# Patient Record
Sex: Male | Born: 1985 | Race: White | Hispanic: No | Marital: Married | Smoking: Current every day smoker
Health system: Southern US, Community
[De-identification: ages and names within clinical notes are randomized; demographics above are authoritative.]

## PROBLEM LIST (undated history)

## (undated) DIAGNOSIS — J302 Other seasonal allergic rhinitis: Secondary | ICD-10-CM

## (undated) DIAGNOSIS — F419 Anxiety disorder, unspecified: Secondary | ICD-10-CM

## (undated) DIAGNOSIS — J45909 Unspecified asthma, uncomplicated: Secondary | ICD-10-CM

## (undated) DIAGNOSIS — T7840XA Allergy, unspecified, initial encounter: Secondary | ICD-10-CM

## (undated) HISTORY — DX: Anxiety disorder, unspecified: F41.9

## (undated) HISTORY — PX: TYMPANOSTOMY TUBE PLACEMENT: SHX32

## (undated) HISTORY — DX: Allergy, unspecified, initial encounter: T78.40XA

## (undated) HISTORY — DX: Unspecified asthma, uncomplicated: J45.909

## (undated) HISTORY — PX: DENTAL SURGERY: SHX609

---
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
## 2010-10-31 ENCOUNTER — Emergency Department (HOSPITAL_COMMUNITY): Admission: EM | Admit: 2010-10-31 | Payer: Self-pay | Source: Home / Self Care

## 2011-04-29 ENCOUNTER — Encounter (HOSPITAL_COMMUNITY): Payer: Self-pay | Admitting: *Deleted

## 2011-04-29 ENCOUNTER — Emergency Department (HOSPITAL_COMMUNITY)
Admission: EM | Admit: 2011-04-29 | Discharge: 2011-04-29 | Disposition: A | Discharge status: Home / Self Care | Payer: 59 | Attending: Emergency Medicine | Admitting: Emergency Medicine

## 2011-04-29 DIAGNOSIS — S61409A Unspecified open wound of unspecified hand, initial encounter: Secondary | ICD-10-CM | POA: Insufficient documentation

## 2011-04-29 DIAGNOSIS — W260XXA Contact with knife, initial encounter: Secondary | ICD-10-CM | POA: Insufficient documentation

## 2011-04-29 DIAGNOSIS — Z23 Encounter for immunization: Secondary | ICD-10-CM | POA: Insufficient documentation

## 2011-04-29 DIAGNOSIS — S61419A Laceration without foreign body of unspecified hand, initial encounter: Secondary | ICD-10-CM

## 2011-04-29 DIAGNOSIS — W261XXA Contact with sword or dagger, initial encounter: Secondary | ICD-10-CM | POA: Insufficient documentation

## 2011-04-29 MED ORDER — LIDOCAINE HCL (PF) 1 % IJ SOLN
INTRAMUSCULAR | Status: AC
  Filled 2011-04-29: qty 5

## 2011-04-29 MED ORDER — DOUBLE ANTIBIOTIC 500-10000 UNIT/GM EX OINT
TOPICAL_OINTMENT | Freq: Once | CUTANEOUS | Status: AC
  Administered 2011-04-29: 13:00:00 via TOPICAL
  Filled 2011-04-29: qty 1

## 2011-04-29 MED ORDER — TETANUS-DIPHTHERIA TOXOIDS TD 5-2 LFU IM INJ
0.5000 mL | INJECTION | Freq: Once | INTRAMUSCULAR | Status: AC
  Administered 2011-04-29: 0.5 mL via INTRAMUSCULAR
  Filled 2011-04-29: qty 0.5

## 2011-04-29 MED ORDER — TETANUS-DIPHTH-ACELL PERTUSSIS 5-2.5-18.5 LF-MCG/0.5 IM SUSP: 0.5000 mL | Freq: Once | INTRAMUSCULAR | Status: DC

## 2011-04-29 MED ORDER — LIDOCAINE HCL (PF) 1 % IJ SOLN
5.0000 mL | Freq: Once | INTRAMUSCULAR | Status: AC
  Administered 2011-04-29: 5 mL via INTRADERMAL

## 2011-04-29 NOTE — ED Notes (Signed)
Laceration to base of right 4th finger. Bleeding controlled. 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 Unsure of last tetanus.

## 2011-04-29 NOTE — ED Provider Notes (Signed)
History     CSN: [id number]  Arrival date & time 04/29/11  1013   First MD Initiated Contact with Patient 04/29/11 1129      Chief Complaint  Patient presents with  . Extremity Laceration    (Consider location/radiation/quality/duration/timing/severity/associated sxs/prior treatment) Patient is a 26 y.o. male presenting with skin laceration. The history is provided by the patient. No language interpreter was used.  Laceration  The incident occurred 3 to 5 hours ago. The laceration is located on the right hand. The laceration is 4 cm in size. The laceration mechanism was a a clean knife. The pain is mild. The pain has been constant since onset. He reports no foreign bodies present. His tetanus status is unknown.    History reviewed. No pertinent past medical history.  Past Surgical History  Procedure Date  . Dental surgery     No family history on file.  History  Substance Use Topics  . Smoking status: Never Smoker   . Smokeless tobacco: Current User    Types: Snuff  . Alcohol Use: Yes     Occ      Review of Systems  Constitutional: Negative for fever and chills.  Gastrointestinal: Negative for vomiting.  Musculoskeletal: Negative for myalgias and arthralgias.  Skin: Positive for wound. Negative for rash.  Neurological: Negative for weakness and numbness.  Hematological: Does not bruise/bleed easily.  All other systems reviewed and are negative.    Allergies  Erythromycin and Penicillins  Home Medications  No current outpatient prescriptions on file.  BP 136/84  Pulse 91  Temp(Src) 98.8 F (37.1 C) (Oral)  Resp 16  Ht 5\' 11"  (1.803 m)  Wt 165 lb (74.844 kg)  BMI 23.01 kg/m2  SpO2 100%  Physical Exam  Nursing note and vitals reviewed. Constitutional: He is oriented to person, place, and time. He appears well-developed and well-nourished. No distress.  HENT:  Head: Normocephalic and atraumatic.  Mouth/Throat: Oropharynx is clear and moist.    Cardiovascular: Normal rate, regular rhythm and normal heart sounds.   Pulmonary/Chest: Effort normal and breath sounds normal. No respiratory distress.  Musculoskeletal: Normal range of motion. He exhibits tenderness. He exhibits no edema.       Right hand: He exhibits laceration. He exhibits normal range of motion, no tenderness, no bony tenderness, normal two-point discrimination, normal capillary refill, no deformity and no swelling. normal sensation noted. Normal strength noted.       Hands: Neurological: He is alert and oriented to person, place, and time. He exhibits normal muscle tone. Coordination normal.  Skin: Skin is warm and dry.       See ms exam    ED Course  Procedures (including critical care time)    LACERATION REPAIR Performed by: Shauna Bodkins L. Authorized by: Maxwell Caul Consent: Verbal consent obtained. Risks and benefits: risks, benefits and alternatives were discussed Consent given by: patient Patient identity confirmed: provided demographic data Prepped and Draped in normal sterile fashion Wound explored  Laceration Location: dorsal right hand Laceration Length:  4cm  No Foreign Bodies seen or palpated  Anesthesia: local infiltration  Local anesthetic: lidocaine1% w/o epinephrine  Anesthetic total: 3 ml  Irrigation method: syringe Amount of cleaning: standard  Skin closure: 4-0 prolene Number of sutures: 5 Technique: simple interrupted  Patient tolerance: Patient tolerated the procedure well with no immediate complications.    MDM   Wound(s) explored with adequate hemostasis through ROM, no apparent gross foreign body retained, no significant involvement of deep structures such  as bone / joint / tendon / or neurovascular involvement noted.  Baseline Strength and Sensation to affected extremity(ies) with normal light touch for Pt, distal NVI with CR< 2 secs and pulse(s) intact to affected extremity(ies).      Patient / Family /  Caregiver understand and agree with initial ED impression and plan with expectations set for ED visit. Pt stable in ED with no significant deterioration in condition.   Mujahid Jalomo L. [employer], [place] 05/01/11 1952

## 2011-04-29 NOTE — ED Notes (Signed)
Pt has laceration to the base of his right 4th finger. No bleeding at present. Pt able to move his finger with no difficulty. Pt's hand pink and warm to touch. Brisk capillary refill to nailbeds.

## 2011-05-02 NOTE — ED Provider Notes (Signed)
Pt has a linear laceration near the MCP of his RRF from a knife, placed horizontally. The wound appears clean.   Medical screening examination/treatment/procedure(s) were conducted as a shared visit with non-physician practitioner(s) and myself.  I personally evaluated the patient during the encounter Devoria Albe, MD, Franz Dell, MD 05/02/11 [phone]

## 2012-03-05 ENCOUNTER — Ambulatory Visit (INDEPENDENT_AMBULATORY_CARE_PROVIDER_SITE_OTHER): Payer: 59 | Admitting: Family Medicine

## 2012-03-05 VITALS — BP 149/73 | HR 73 | Temp 98.1°F | Resp 18 | Ht 72.0 in | Wt 160.0 lb

## 2012-03-05 DIAGNOSIS — J40 Bronchitis, not specified as acute or chronic: Secondary | ICD-10-CM

## 2012-03-05 MED ORDER — AZITHROMYCIN 250 MG PO TABS: ORAL_TABLET | ORAL | Status: DC

## 2012-03-05 MED ORDER — HYDROCODONE-HOMATROPINE 5-1.5 MG/5ML PO SYRP: 5.0000 mL | ORAL_SOLUTION | Freq: Three times a day (TID) | ORAL | Status: DC | PRN

## 2012-03-05 NOTE — Progress Notes (Signed)
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 He's taken some Allegra for this but the symptoms have just fluctuated. In the last week he's developed more cough without fever. There is no significant production of phlegm. He's had no sore throat, chills, shortness of breath or chest pain  Objective: HEENT is unremarkable No acute distress Chest: Few rhonchi Heart: Regular Skin: Unremarkable  Assessment: Bronchitis, worsening  Plan: Z-Pak and Hydromet

## 2014-11-16 ENCOUNTER — Encounter (HOSPITAL_COMMUNITY): Payer: Self-pay | Admitting: *Deleted

## 2014-11-16 ENCOUNTER — Emergency Department (HOSPITAL_COMMUNITY)
Admission: EM | Admit: 2014-11-16 | Discharge: 2014-11-16 | Disposition: A | Discharge status: Home / Self Care | Payer: Commercial Managed Care - PPO | Attending: Emergency Medicine | Admitting: Emergency Medicine

## 2014-11-16 ENCOUNTER — Emergency Department (HOSPITAL_COMMUNITY): Payer: Commercial Managed Care - PPO

## 2014-11-16 DIAGNOSIS — Z792 Long term (current) use of antibiotics: Secondary | ICD-10-CM | POA: Diagnosis not present

## 2014-11-16 DIAGNOSIS — M5442 Lumbago with sciatica, left side: Secondary | ICD-10-CM | POA: Diagnosis not present

## 2014-11-16 DIAGNOSIS — Z79899 Other long term (current) drug therapy: Secondary | ICD-10-CM | POA: Insufficient documentation

## 2014-11-16 DIAGNOSIS — M545 Low back pain: Secondary | ICD-10-CM | POA: Diagnosis present

## 2014-11-16 DIAGNOSIS — Z880 Allergy status to penicillin: Secondary | ICD-10-CM | POA: Insufficient documentation

## 2014-11-16 DIAGNOSIS — J45909 Unspecified asthma, uncomplicated: Secondary | ICD-10-CM | POA: Insufficient documentation

## 2014-11-16 LAB — RAPID URINE DRUG SCREEN, HOSP PERFORMED
AMPHETAMINES: NOT DETECTED
BARBITURATES: NOT DETECTED
Benzodiazepines: NOT DETECTED
Cocaine: NOT DETECTED
OPIATES: NOT DETECTED
TETRAHYDROCANNABINOL: NOT DETECTED

## 2014-11-16 LAB — URINALYSIS, ROUTINE W REFLEX MICROSCOPIC
BILIRUBIN URINE: NEGATIVE
Glucose, UA: NEGATIVE mg/dL
HGB URINE DIPSTICK: NEGATIVE
KETONES UR: 15 mg/dL — AB
Leukocytes, UA: NEGATIVE
NITRITE: NEGATIVE
Protein, ur: NEGATIVE mg/dL
SPECIFIC GRAVITY, URINE: 1.015 (ref 1.005–1.030)
UROBILINOGEN UA: 0.2 mg/dL (ref 0.0–1.0)
pH: 7 (ref 5.0–8.0)

## 2014-11-16 LAB — CBC WITH DIFFERENTIAL/PLATELET
BASOS ABS: 0 10*3/uL (ref 0.0–0.1)
BASOS PCT: 1 % (ref 0–1)
EOS PCT: 0 % (ref 0–5)
Eosinophils Absolute: 0 10*3/uL (ref 0.0–0.7)
HCT: 45.1 % (ref 39.0–52.0)
Hemoglobin: 16.1 g/dL (ref 13.0–17.0)
LYMPHS PCT: 24 % (ref 12–46)
Lymphs Abs: 1.9 10*3/uL (ref 0.7–4.0)
MCH: 31.5 pg (ref 26.0–34.0)
MCHC: 35.7 g/dL (ref 30.0–36.0)
MCV: 88.3 fL (ref 78.0–100.0)
MONO ABS: 0.5 10*3/uL (ref 0.1–1.0)
Monocytes Relative: 6 % (ref 3–12)
Neutro Abs: 5.3 10*3/uL (ref 1.7–7.7)
Neutrophils Relative %: 69 % (ref 43–77)
PLATELETS: 290 10*3/uL (ref 150–400)
RBC: 5.11 MIL/uL (ref 4.22–5.81)
RDW: 11.9 % (ref 11.5–15.5)
WBC: 7.8 10*3/uL (ref 4.0–10.5)

## 2014-11-16 LAB — COMPREHENSIVE METABOLIC PANEL
ALBUMIN: 4.7 g/dL (ref 3.5–5.0)
ALT: 29 U/L (ref 17–63)
AST: 41 U/L (ref 15–41)
Alkaline Phosphatase: 65 U/L (ref 38–126)
Anion gap: 16 — ABNORMAL HIGH (ref 5–15)
CHLORIDE: 102 mmol/L (ref 101–111)
CO2: 21 mmol/L — AB (ref 22–32)
CREATININE: 1.01 mg/dL (ref 0.61–1.24)
Calcium: 9.9 mg/dL (ref 8.9–10.3)
GFR calc Af Amer: >60 mL/min (ref >60)
GFR calc non Af Amer: >60 mL/min (ref >60)
GLUCOSE: 119 mg/dL — AB (ref 65–99)
POTASSIUM: 3.5 mmol/L (ref 3.5–5.1)
SODIUM: 139 mmol/L (ref 135–145)
Total Bilirubin: 1.8 mg/dL — ABNORMAL HIGH (ref 0.3–1.2)
Total Protein: 8 g/dL (ref 6.5–8.1)

## 2014-11-16 LAB — C-REACTIVE PROTEIN

## 2014-11-16 MED ORDER — HYDROMORPHONE HCL 1 MG/ML IJ SOLN: 1.0000 mg | Freq: Once | INTRAMUSCULAR | Status: DC

## 2014-11-16 MED ORDER — IBUPROFEN 800 MG PO TABS: 800.0000 mg | ORAL_TABLET | Freq: Three times a day (TID) | ORAL | Status: DC

## 2014-11-16 MED ORDER — LORAZEPAM 1 MG PO TABS
1.0000 mg | ORAL_TABLET | Freq: Once | ORAL | Status: AC
Start: 2014-11-16 — End: 2014-11-16
  Administered 2014-11-16: 1 mg via ORAL
  Filled 2014-11-16: qty 2

## 2014-11-16 MED ORDER — METHOCARBAMOL 500 MG PO TABS: 500.0000 mg | ORAL_TABLET | Freq: Two times a day (BID) | ORAL | Status: DC

## 2014-11-16 MED ORDER — DEXAMETHASONE SODIUM PHOSPHATE 10 MG/ML IJ SOLN
10.0000 mg | Freq: Once | INTRAMUSCULAR | Status: AC
  Administered 2014-11-16: 10 mg via INTRAMUSCULAR
  Filled 2014-11-16: qty 1

## 2014-11-16 NOTE — ED Notes (Signed)
Pt reports bilateral hand tingling. MAE well.

## 2014-11-16 NOTE — ED Provider Notes (Signed)
CSN: [id number]     Arrival date & time 11/16/14  1443 History   First MD Initiated Contact with Patient 11/16/14 1942     Chief Complaint  Patient presents with  . Back Pain    HPI   Aaron Levine is a 29 y.o. male with no significant PMH who presents to the ED with back pain. He reports he was sitting down watching TV last night when his pain started. He reports his pain is located in his lower back and that he has tingling down his left leg. He reports his symptoms are constant. He states initially, sitting exacerbated his pain. He reports movement and changing position worsen his symptoms. He has tried ice for symptom relief, which was only minimally effective. He reports he works at [employer] job, but was doing labor around the house this weekend. He denies recent trauma.  Patient denies history of cancer, IVDU, anticoagulant use, bowel or bladder incontinence, saddle anesthesia, weakness. Reports he is able to ambulate. Denies fever, chills, chest pain, shortness of breath, abdominal pain, nausea, vomiting, diarrhea, constipation, dysuria, urgency, frequency, penile discharge, syncope, lightheadedness, dizziness, headache.    Past Medical History  Diagnosis Date  . Allergy   . Asthma    Past Surgical History  Procedure Laterality Date  . Dental surgery     No family history on file. Social History  Substance Use Topics  . Smoking status: Never Smoker   . Smokeless tobacco: Current User    Types: Snuff  . Alcohol Use: 1.0 oz/week    2 drink(s) per week     Comment: Occ    Review of Systems  Constitutional: Negative for fever, chills, diaphoresis, appetite change and fatigue.  Eyes: Negative for visual disturbance.  Respiratory: Negative for shortness of breath.   Cardiovascular: Negative for chest pain, palpitations and leg swelling.  Gastrointestinal: Negative for nausea, vomiting, abdominal pain, diarrhea and constipation.  Genitourinary: Negative for dysuria, urgency,  frequency, flank pain and discharge.  Musculoskeletal: Positive for back pain and arthralgias. Negative for myalgias, gait problem, neck pain and neck stiffness.  Skin: Negative for color change, pallor, rash and wound.  Neurological: Positive for numbness. Negative for dizziness, syncope, weakness, light-headedness and headaches.       Reports numbness and paresthesia to left lower extremity.  All other systems reviewed and are negative.    Allergies  Erythromycin and Penicillins  Home Medications   Prior to Admission medications   Medication Sig Start Date End Date Taking? Authorizing Provider  azithromycin (ZITHROMAX Z-PAK) 250 MG tablet Take as directed on pack 03/05/12   Elvina Sidle, MD  HYDROcodone-homatropine [facility]) 5-1.5 MG/5ML syrup Take 5 mLs by mouth every 8 (eight) hours as needed for cough. 03/05/12   Elvina Sidle, MD  ibuprofen (ADVIL,MOTRIN) 800 MG tablet Take 1 tablet (800 mg total) by mouth 3 (three) times daily. 11/16/14   Mady Gemma, PA-C  methocarbamol (ROBAXIN) 500 MG tablet Take 1 tablet (500 mg total) by mouth 2 (two) times daily. 11/16/14   Mady Gemma, PA-C  Pseudoephedrine-APAP-DM (DAYQUIL MULTI-SYMPTOM COLD/FLU PO) Take by mouth.    Historical Provider, MD    BP 138/94 mmHg  Pulse 85  Temp(Src) 99.3 F (37.4 C) (Oral)  Resp 15  Ht 5\' 10"  (1.778 m)  Wt 165 lb (74.844 kg)  BMI 23.68 kg/m2  SpO2 97% Physical Exam  Constitutional: He is oriented to person, place, and time. He appears well-developed and well-nourished. No distress.  HENT:  Head: Normocephalic and atraumatic.  Right Ear: External ear normal.  Left Ear: External ear normal.  Nose: Nose normal.  Mouth/Throat: Oropharynx is clear and moist. No oropharyngeal exudate.  Eyes: Conjunctivae and EOM are normal. Pupils are equal, round, and reactive to light. Right eye exhibits no discharge. Left eye exhibits no discharge. No scleral icterus.  Neck: Normal range of  motion. Neck supple. No spinous process tenderness and no muscular tenderness present.  Cardiovascular: Normal rate, regular rhythm, normal heart sounds and intact distal pulses.   Pulmonary/Chest: Effort normal and breath sounds normal. No respiratory distress. He has no wheezes. He has no rales. He exhibits no tenderness.  Abdominal: Soft. Normal appearance and bowel sounds are normal. He exhibits no distension and no mass. There is no tenderness. There is no rebound, no guarding and no CVA tenderness.  Musculoskeletal: Normal range of motion. He exhibits tenderness. He exhibits no edema.       Lumbar back: He exhibits tenderness and bony tenderness. He exhibits no deformity.  Lumbar spine tender to palpation at midline. No step-off or deformity. Tenderness to palpation of left lumbar paraspinal muscles. No pain with straight leg raise bilaterally.   Neurological: He is alert and oriented to person, place, and time. He has normal strength. No sensory deficit.  Skin: Skin is warm, dry and intact. No rash noted. He is not diaphoretic. No erythema. No pallor.  Psychiatric: He has a normal mood and affect. His behavior is normal. Judgment and thought content normal.  Nursing note and vitals reviewed.   ED Course  Procedures (including critical care time)  Labs Review Labs Reviewed  COMPREHENSIVE METABOLIC PANEL - Abnormal; Notable for the following:    CO2 21 (*)    Glucose, Bld 119 (*)    BUN <5 (*)    Total Bilirubin 1.8 (*)    Anion gap 16 (*)    All other components within normal limits  URINALYSIS, ROUTINE W REFLEX MICROSCOPIC (NOT AT [facility]) - Abnormal; Notable for the following:    Ketones, ur 15 (*)    All other components within normal limits  CBC WITH DIFFERENTIAL/PLATELET  URINE RAPID DRUG SCREEN, HOSP PERFORMED  C-REACTIVE PROTEIN    Imaging Review Dg Lumbar Spine Complete  11/16/2014   CLINICAL DATA:  Low back pain  EXAM: LUMBAR SPINE - COMPLETE 4+ VIEW  COMPARISON:  None   FINDINGS: There is no evidence of lumbar spine fracture. Alignment is normal. Intervertebral disc spaces are maintained.  IMPRESSION: Negative.   Electronically Signed   By: Signa Kell M.D.   On: 11/16/2014 20:51     I have personally reviewed and evaluated these images and lab results as part of my medical decision-making.   EKG Interpretation None      MDM   Final diagnoses:  Low back pain with left-sided sciatica, unspecified back pain laterality   29 year old male presents with low back pain, which began yesterday. Patient denies history of cancer, IVDU, anticoagulant use, bowel or bladder incontinence, saddle anesthesia, weakness. Tenderness to palpation over midline lumbar spine and over left lumbar paraspinal muscles on exam. No step off or deformity. He is able to ambulate. No neurological deficits and normal neuro exam. No concern for cauda equina.  Given midline tenderness, x-ray of lumbar spine obtained. Imaging negative for fracture.  Symptoms most likely consistent with lumbar strain, given recent history of house work over the weekend. Given decadron injection in the ED. Patient reports pain  is controlled at this time. Patient is afebrile and vital signs are stable. Will discharge with ibuprofen and robaxin. Return precautions discussed. Patient to follow up with PCP this week.   BP 138/94 mmHg  Pulse 85  Temp(Src) 99.3 F (37.4 C) (Oral)  Resp 15  Ht 5\' 10"  (1.778 m)  Wt 165 lb (74.844 kg)  BMI 23.68 kg/m2  SpO2 97%      Mady Gemma, PA-C 11/17/14 0114  Eber Hong, MD 11/18/14 [phone]

## 2014-11-16 NOTE — ED Notes (Signed)
Pt reports back pain today while at work. Pt reports using ice and with some relief. Pt reports tingling in legs. PMS intact. Pain is not worsened with movement but with sitting. Pt appears anxious in triage.

## 2014-11-16 NOTE — Discharge Instructions (Signed)
1. Medications: ibuprofen, robaxin, usual home medications 2. Treatment: rest, drink plenty of fluids, ice, back exercises 3. Follow Up: please followup with your primary doctor this week for discussion of your diagnoses and further evaluation after today's visit; if you do not have a primary care doctor use the resource guide provided to find one; please return to the ER for fever chills, bowel or bladder incontinence, numbness in saddle region, weakness, new or worsening symptoms   Back Exercises Back exercises help treat and prevent back injuries. The goal of back exercises is to increase the strength of your abdominal and back muscles and the flexibility of your back. These exercises should be started when you no longer have back pain. Back exercises include:  Pelvic Tilt. Lie on your back with your knees bent. Tilt your pelvis until the lower part of your back is against the floor. Hold this position 5 to 10 sec and repeat 5 to 10 times.  Knee to Chest. Pull first 1 knee up against your chest and hold for 20 to 30 seconds, repeat this with the other knee, and then both knees. This may be done with the other leg straight or bent, whichever feels better.  Sit-Ups or Curl-Ups. Bend your knees 90 degrees. Start with tilting your pelvis, and do a partial, slow sit-up, lifting your trunk only 30 to 45 degrees off the floor. Take at least 2 to 3 seconds for each sit-up. Do not do sit-ups with your knees out straight. If partial sit-ups are difficult, simply do the above but with only tightening your abdominal muscles and holding it as directed.  Hip-Lift. Lie on your back with your knees flexed 90 degrees. Push down with your feet and shoulders as you raise your hips a couple inches off the floor; hold for 10 seconds, repeat 5 to 10 times.  Back arches. Lie on your stomach, propping yourself up on bent elbows. Slowly press on your hands, causing an arch in your low back. Repeat 3 to 5 times. Any  initial stiffness and discomfort should lessen with repetition over time.  Shoulder-Lifts. Lie face down with arms beside your body. Keep hips and torso pressed to floor as you slowly lift your head and shoulders off the floor. Do not overdo your exercises, especially in the beginning. Exercises may cause you some mild back discomfort which lasts for a few minutes; however, if the pain is more severe, or lasts for more than 15 minutes, do not continue exercises until you see your caregiver. Improvement with exercise therapy for back problems is slow.  See your caregivers for assistance with developing a proper back exercise program. Document Released: 04/20/2004 Document Revised: 06/05/2011 Document Reviewed: 01/12/2011 [facility] Patient Information 2015 [place], [place]. This information is not intended to replace advice given to you by your health care provider. Make sure you discuss any questions you have with your health care provider.  Back Pain, Adult Back pain is very common. The pain often gets better over time. The cause of back pain is usually not dangerous. Most people can learn to manage their back pain on their own.  HOME CARE   Stay active. Start with short walks on flat ground if you can. Try to walk farther each day.  Do not sit, drive, or stand in one place for more than 30 minutes. Do not stay in bed.  Do not avoid exercise or work. Activity can help your back heal faster.  Be careful when you bend or  lift an object. Bend at your knees, keep the object close to you, and do not twist.  Sleep on a firm mattress. Lie on your side, and bend your knees. If you lie on your back, put a pillow under your knees.  Only take medicines as told by your doctor.  Put ice on the injured area.  Put ice in a plastic bag.  Place a towel between your skin and the bag.  Leave the ice on for 15-20 minutes, 03-04 times a day for the first 2 to 3 days. After that, you can switch between ice and  heat packs.  Ask your doctor about back exercises or massage.  Avoid feeling anxious or stressed. Find good ways to deal with stress, such as exercise. GET HELP RIGHT AWAY IF:   Your pain does not go away with rest or medicine.  Your pain does not go away in 1 week.  You have new problems.  You do not feel well.  The pain spreads into your legs.  You cannot control when you poop (bowel movement) or pee (urinate).  Your arms or legs feel weak or lose feeling (numbness).  You feel sick to your stomach (nauseous) or throw up (vomit).  You have belly (abdominal) pain.  You feel like you may pass out (faint). MAKE SURE YOU:   Understand these instructions.  Will watch your condition.  Will get help right away if you are not doing well or get worse. Document Released: 08/30/2007 Document Revised: 06/05/2011 Document Reviewed: 07/15/2013 [facility] Patient Information 2015 [place], [place]. This information is not intended to replace advice given to you by your health care provider. Make sure you discuss any questions you have with your health care provider.   Emergency Department Resource Guide 1) Find a Doctor and Pay Out of Pocket Although you won't have to find out who is covered by your insurance plan, it is a good idea to ask around and get recommendations. You will then need to call the office and see if the doctor you have chosen will accept you as a new patient and what types of options they offer for patients who are self-pay. Some doctors offer discounts or will set up payment plans for their patients who do not have insurance, but you will need to ask so you aren't surprised when you get to your appointment.  2) Contact Your Local Health Department Not all health departments have doctors that can see patients for sick visits, but many do, so it is worth a call to see if yours does. If you don't know where your local health department is, you can check in your phone book.  The CDC also has a tool to help you locate your state's health department, and many state websites also have listings of all of their local health departments.  3) Find a Walk-in Clinic If your illness is not likely to be very severe or complicated, you may want to try a walk in clinic. These are popping up all over the country in pharmacies, drugstores, and shopping centers. They're usually staffed by nurse practitioners or physician assistants that have been trained to treat common illnesses and complaints. They're usually fairly quick and inexpensive. However, if you have serious medical issues or chronic medical problems, these are probably not your best option.  No Primary Care Doctor: - Call Health Connect at  [phone] - they can help you locate a primary care doctor that  accepts your insurance, provides certain  services, etc. - Physician Referral Service- [phone]  Chronic Pain Problems: Organization         Address  Phone   Notes  [facility]  [phone] Patients need to be referred by their primary care doctor.   Medication Assistance: Organization         Address  Phone   Notes  [facility] Medication [facility] [address] [place] [place], [place] [address] [phone] --Must be a resident of [facility] -- Must have NO insurance coverage whatsoever (no Medicaid/ Medicare, etc.) -- The pt. MUST have a primary care doctor that directs their care regularly and follows them in the community   [facility]  [phone]   [employer]  [phone]    Agencies that provide inexpensive medical care: Organization         Address  Phone   Notes  [facility]  [phone]   [facility]    [phone]   [facility] [address] [place], [place] [address] [phone]   [facility] [address], [place] [phone]   Planned Parenthood     [phone]   [facility]    [phone]   [facility] and [facility]  [address], [place] Phone:  [phone], Fax:  [phone] Hours of Operation:  9 am - 6 pm, M-F.  Also accepts Medicaid/Medicare and self-pay.  [facility]  [address], [place] Phone: [phone], Fax: [phone]. Hours of Operation:  8:30 am - 5:30 pm, M-F.  Also accepts Medicaid and self-pay.  [facility] [place] [address], [place] Phone: [phone]   [facility] [address] Natasha Bence [place], [place] [phone], Ext. [phone] Mondays & Thursdays: 7-9 AM.  First 15 patients are seen on a first come, first serve basis.    Medicaid-accepting [facility] Providers:  Organization         Address  Phone   Notes  [facility] [address], [place] [phone] Also accepts self-pay patients.  [facility] [address] Laurell Josephs [place], [place]  [phone]   [facility] [address], [place] [phone]   [facility] [address] [place] [phone]   Renaye Rakers [address], [place]   [phone] Only accepts [place] Access [place] patients after they have their name applied to their card.   Self-Pay (no insurance) in [facility]:  Organization         Address  Phone   Notes  Sickle Cell Patients, [facility] [address] [place], [place] [phone]   [facility] [address] [place], [place] [phone]   [facility]  [address], [place] [phone]   [facility]/Dr. Osei-Bonsu  [address] [place] or [address], [place] [phone] Phone number for both [place] and [place] locations is the same.  Urgent Medical and  [facility] [address], [place] [phone]   [facility] [address], [place] or [address] [phone]   [facility] [address]  [place] [phone], phone; [phone], fax Sees patients 1st and 3rd Saturday of every month.  Must not qualify for public or private insurance (i.e. Medicaid, Medicare, [place] Health Choice, Veterans' Benefits)  Household income should be no more than 200% of the poverty level The clinic cannot treat you if you are pregnant or think you are pregnant  Sexually transmitted diseases are not treated at the clinic.    Dental Care: Organization         Address  Phone  Notes  [facility] Department of [facility] [address] [place], [place] [phone] Accepts children up to age 7 who are enrolled in [place] or [place] Health Choice; pregnant women with a Medicaid card; and children who have applied for Medicaid or [place] Health Choice, but were declined, whose parents can pay a reduced fee at time of service.  [facility] Department of [facility]  [address], [place] [phone] Accepts children up to age 33 who are enrolled in [place] or [place] Health Choice; pregnant women with a Medicaid card; and children who have applied for Medicaid or  Health Choice, but were declined, whose parents can pay a reduced fee at time of service.  [facility]  [address] [place], [place] [phone] Patients are seen by appointment only. Walk-ins are not accepted. [facility] will see patients 19 years of age and older. Monday - Tuesday (8am-5pm) Most Wednesdays (8:30-5pm) $30 per visit, cash only  [facility] Adult Dental Access PRO[facility]  [address], [facility] [phone] Patients are seen by appointment only. Walk-ins are not accepted. [facility] will see patients 37 years of age and  older. One Wednesday Evening (Monthly: Volunteer Based).  $30 per visit, cash only  [employer]  [phone] for adults; Children under age 38, call Graduate Pediatric Dentistry at [phone]. Children aged 76-14, please call [phone] to request a pediatric application.  Dental services are provided in all areas of dental care including fillings, crowns and bridges, complete and partial dentures, implants, gum treatment, root canals, and extractions. Preventive care is also provided. Treatment is provided to both adults and children. Patients are selected via a lottery and there is often a waiting list.   [facility] [address], [place]  [phone] [email or web address]   [facility] [address] [place], [place] [phone], Ext. [phone] Second and Fourth Thursday of each month, opens at 6:30 AM; Clinic ends at 9 AM.  Patients are seen on a first-come first-served basis, and a limited number are seen during each clinic.   [facility]  [address] Ether Griffins [place], [place] [phone]   Eligibility Requirements You must have lived in [place], [place], or [place] counties for at least the last three months.   You cannot be eligible for state or federal sponsored [place], including [employer], [place], or [employer].   You generally cannot be eligible for healthcare insurance through your employer.    How to apply: Eligibility screenings are held every Tuesday and Wednesday afternoon from 1:00 pm until 4:00 pm. You do not need an appointment for the interview!  [facility] [address], [place], [place] [phone]   [facility] Health Department  [phone]   [facility] Health Department  [phone]   [facility] Health Department  [phone]    Behavioral Health  Resources in the Community: Intensive Outpatient Programs Organization          Address  Phone  Notes  [facility] [address], [place], [place] [phone]   [facility] Outpatient [address], [place], [place] [phone]   [facility] [address], [place], [place]  [phone]   [facility] Mental Health [address],  [place], [place] [phone] or [phone]   Substance Abuse Resources Organization         Address  Phone  Notes  [facility]  [phone]   [facility]  [phone]   [facility]  [phone]   Floydene Flock  [phone]   Residential & Outpatient Substance Abuse Program  [phone]   Psychological Services Organization         Address  Phone  Notes  [facility] Behavioral Health  [phone]   [facility] Services  [phone]   [facility] Mental Health [address], [place] [phone] or [phone]    Mobile Crisis Teams Organization         Address  Phone  Notes  [facility], Mobile Crisis Care Unit  [phone]   [facility]  [address] [place], [place] [phone]   Doristine Locks [address] [place] [phone]    Self-Help/Support Groups Organization         Address  Phone             Notes  [facility] [place] - variety of support groups  336- I7437963 Call for more information  Narcotics Anonymous (NA), [facility] [address], [employer] [place]  2 meetings at this location   Statistician         Address  Phone  Notes  [facility] [address],    [place]  [phone]   [facility]  [address] [place] [address], [place], [place] [phone]   [facility] [address] [place], [place] [phone] Admissions: 8am-3pm M-F  [facility] [address]    [place], [place] [phone]   [facility] [address] [place], [place], [place] [phone]   [facility] [address]  [place], [place] [phone]   [facility] - Intensive Outpatient [address] Laurell Josephs [address], [place], [place] [phone]   [facility] ([facility] [address] [place]  [place], [place] [phone] or [phone]   [facility] ([facility]) [address], [place], [place] [phone] Accepts Medicaid  [facility] [address],  [place] [phone] Substance Abuse/Addiction Treatment   [facility] Organization         Address  Phone  Notes  [facility]  [phone]   Angie Fava, PhD [address] Ervin Knack [place], [place]   [phone] or [phone]   [facility] Behavioral   [address] [place], [place] [phone]   [facility] [address], [place], [place] [phone] Insurance/Medicaid/sponsorship through [employer] and Families [address]                                    [place], [place] [phone] Therapy/tele-psych/case  [facility] [address]   [place], [place] (  [phone] [phone]    Dr. Lolly Mustache  [phone]   [facility]  [employer] [facility] Dept. 1) [address] [place] 2) [address] [place] 3)  [address], [place] [phone]  [phone]   [facility] Child Abuse Hotline (678)637-2234 or [phone] (After Hours)
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
## 2014-11-16 NOTE — ED Notes (Signed)
Dr. Rhunette Croft evaluated pt in triage and gave verbal orders.

## 2014-11-27 ENCOUNTER — Other Ambulatory Visit: Payer: Self-pay | Admitting: Family Medicine

## 2014-11-27 DIAGNOSIS — M545 Low back pain: Secondary | ICD-10-CM

## 2014-12-01 ENCOUNTER — Other Ambulatory Visit: Payer: Self-pay | Admitting: Family Medicine

## 2014-12-01 ENCOUNTER — Ambulatory Visit
Admission: RE | Admit: 2014-12-01 | Discharge: 2014-12-01 | Disposition: A | Discharge status: Home / Self Care | Payer: Commercial Managed Care - PPO | Source: Ambulatory Visit | Attending: Family Medicine | Admitting: Family Medicine

## 2014-12-01 ENCOUNTER — Ambulatory Visit: Payer: Commercial Managed Care - PPO

## 2014-12-01 DIAGNOSIS — Z1388 Encounter for screening for disorder due to exposure to contaminants: Secondary | ICD-10-CM

## 2014-12-01 DIAGNOSIS — M545 Low back pain: Secondary | ICD-10-CM

## 2016-08-11 IMAGING — DX DG LUMBAR SPINE COMPLETE 4+V
5 series · 5 of 5 positions shown · non-contrast
Comparison: None

CLINICAL DATA: Low back pain

EXAM:
LUMBAR SPINE - COMPLETE 4+ VIEW

[t lumbar spine ap]
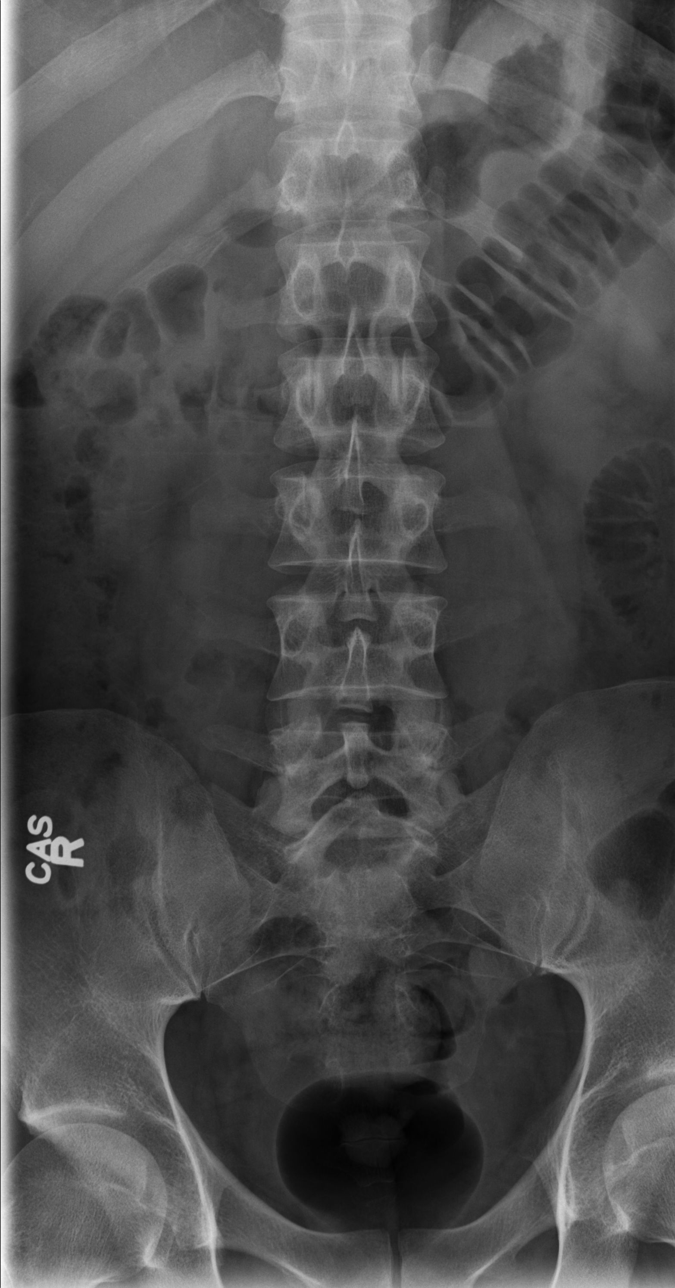

[t lumbar spine obl (1 of 2)]
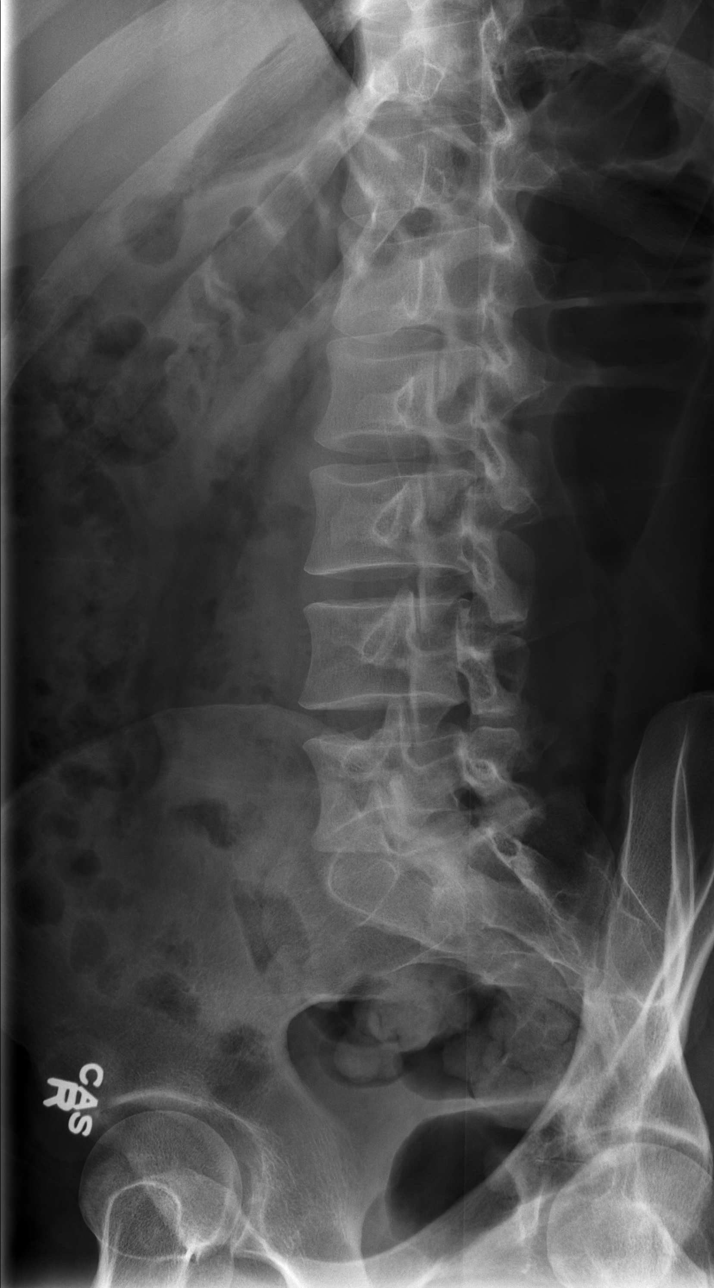

[t lumbar spine obl (2 of 2)]
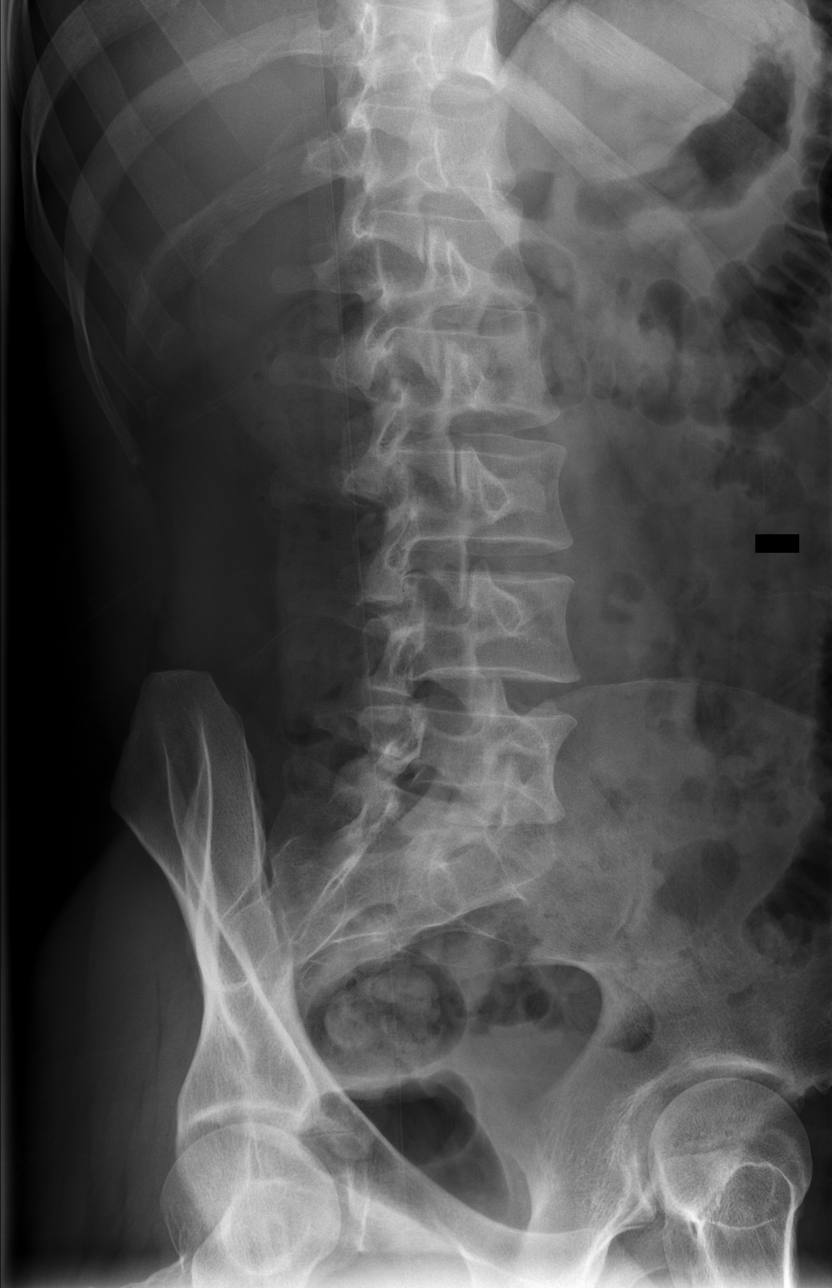

[t lumbar spine lat]
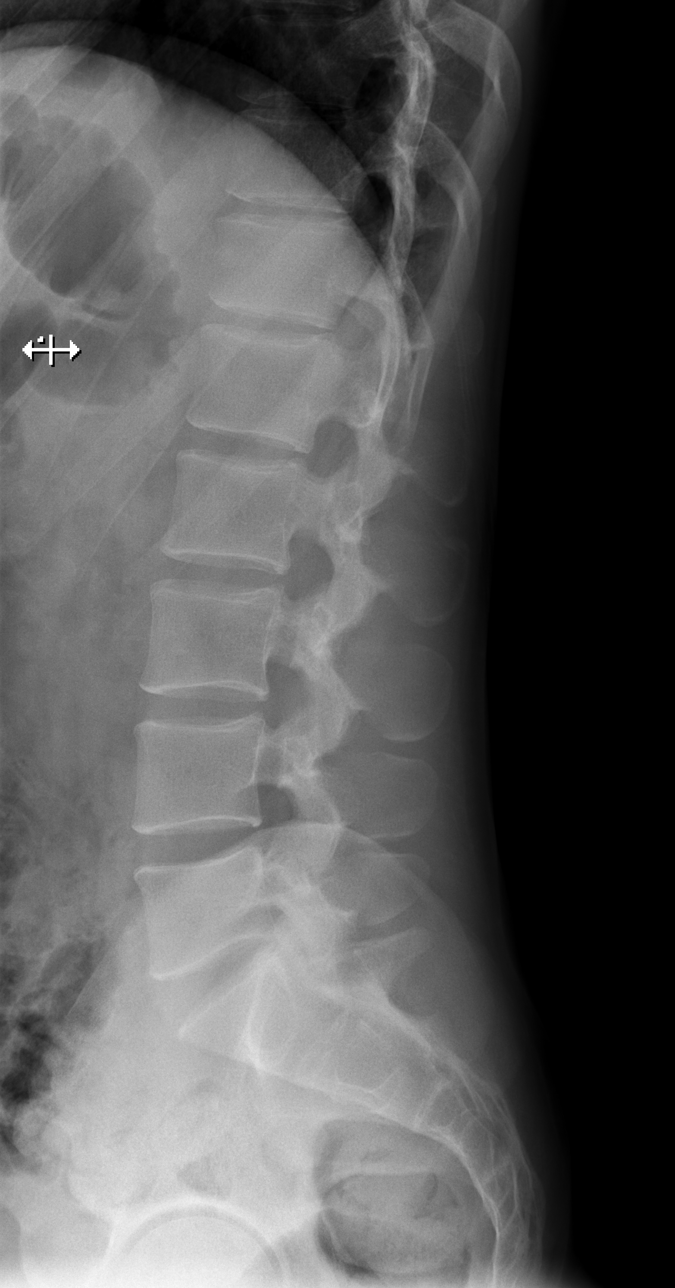

[t lumbar l-5 s-1 spot]
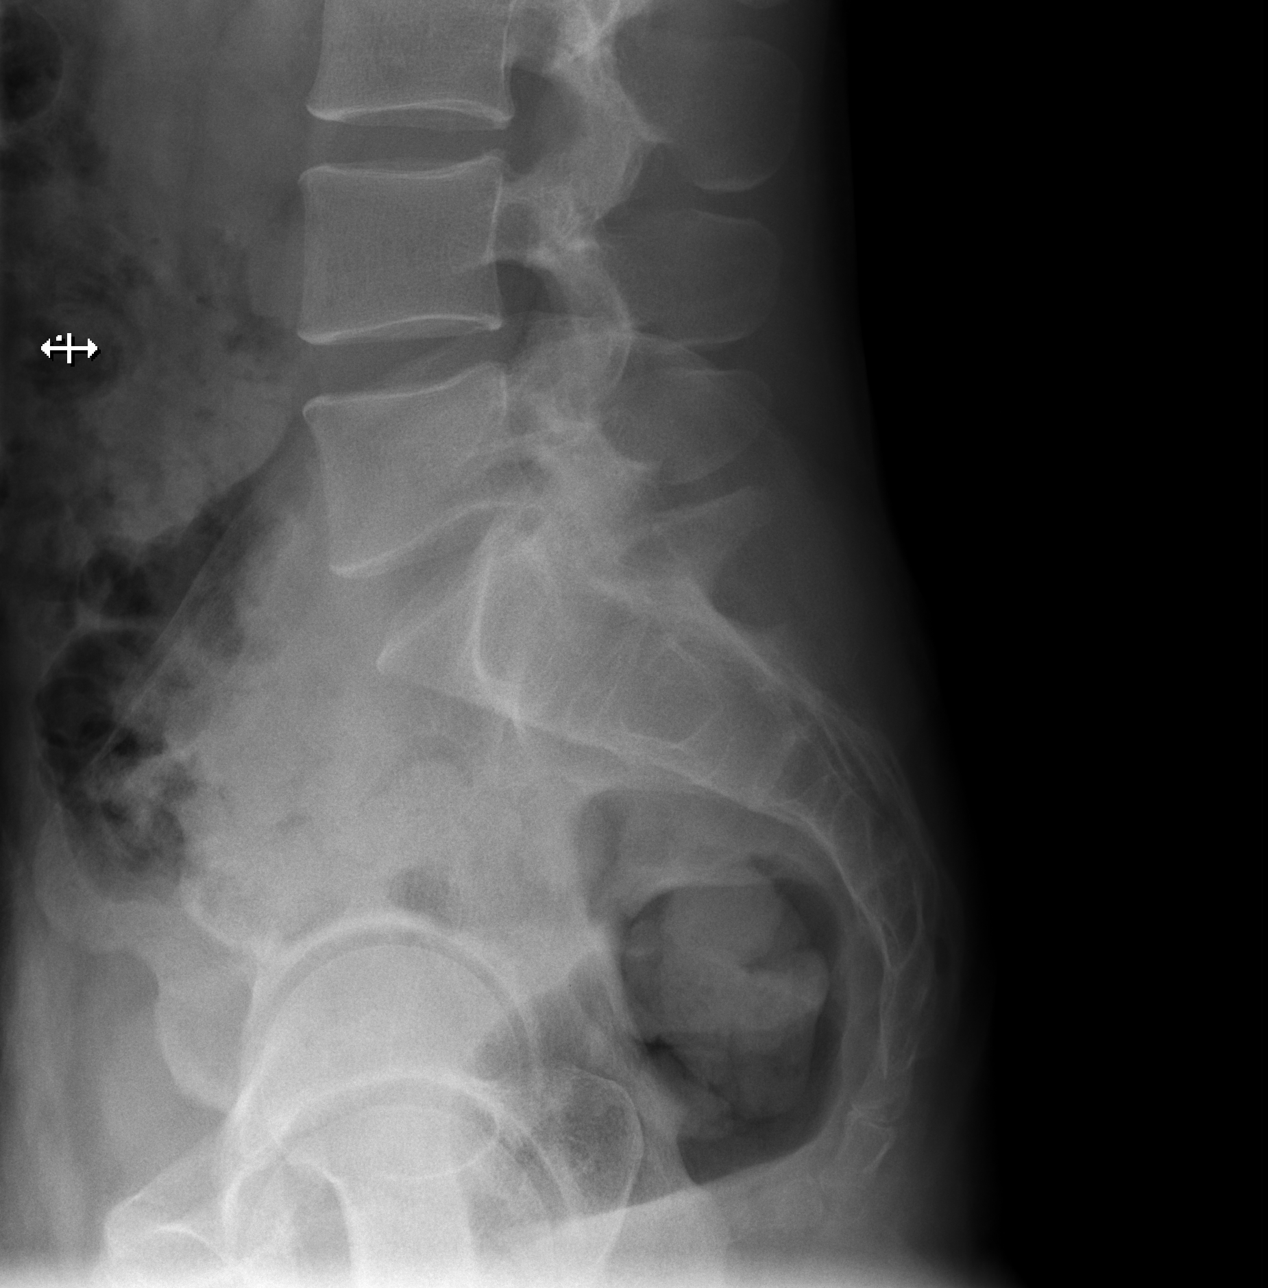

[5 of 5 positions shown; findings below may reference images not displayed]

FINDINGS: There is no evidence of lumbar spine fracture. Alignment is normal.
Intervertebral disc spaces are maintained.
IMPRESSION: Negative.

## 2018-06-23 ENCOUNTER — Encounter (HOSPITAL_COMMUNITY): Payer: Self-pay | Admitting: Emergency Medicine

## 2018-06-23 ENCOUNTER — Other Ambulatory Visit: Payer: Self-pay

## 2018-06-23 ENCOUNTER — Emergency Department (HOSPITAL_COMMUNITY)
Admission: EM | Admit: 2018-06-23 | Discharge: 2018-06-23 | Disposition: A | Discharge status: Home / Self Care | Payer: Commercial Managed Care - PPO | Attending: Emergency Medicine | Admitting: Emergency Medicine

## 2018-06-23 DIAGNOSIS — Z23 Encounter for immunization: Secondary | ICD-10-CM | POA: Insufficient documentation

## 2018-06-23 DIAGNOSIS — Y999 Unspecified external cause status: Secondary | ICD-10-CM | POA: Insufficient documentation

## 2018-06-23 DIAGNOSIS — W321XXA Accidental handgun malfunction, initial encounter: Secondary | ICD-10-CM | POA: Insufficient documentation

## 2018-06-23 DIAGNOSIS — Y9389 Activity, other specified: Secondary | ICD-10-CM | POA: Insufficient documentation

## 2018-06-23 DIAGNOSIS — J45909 Unspecified asthma, uncomplicated: Secondary | ICD-10-CM | POA: Insufficient documentation

## 2018-06-23 DIAGNOSIS — S61012A Laceration without foreign body of left thumb without damage to nail, initial encounter: Secondary | ICD-10-CM | POA: Insufficient documentation

## 2018-06-23 DIAGNOSIS — Y929 Unspecified place or not applicable: Secondary | ICD-10-CM | POA: Insufficient documentation

## 2018-06-23 DIAGNOSIS — Z79899 Other long term (current) drug therapy: Secondary | ICD-10-CM | POA: Insufficient documentation

## 2018-06-23 HISTORY — DX: Other seasonal allergic rhinitis: J30.2

## 2018-06-23 MED ORDER — TETANUS-DIPHTH-ACELL PERTUSSIS 5-2.5-18.5 LF-MCG/0.5 IM SUSP
0.5000 mL | Freq: Once | INTRAMUSCULAR | Status: AC
  Administered 2018-06-23: 0.5 mL via INTRAMUSCULAR
  Filled 2018-06-23: qty 0.5

## 2018-06-23 MED ORDER — LIDOCAINE HCL (PF) 1 % IJ SOLN
5.0000 mL | Freq: Once | INTRAMUSCULAR | Status: AC
  Administered 2018-06-23: 5 mL

## 2018-06-23 MED ORDER — POVIDONE-IODINE 10 % EX SOLN
CUTANEOUS | Status: AC
  Administered 2018-06-23: 3
  Filled 2018-06-23: qty 45

## 2018-06-23 MED ORDER — LIDOCAINE HCL (PF) 1 % IJ SOLN
INTRAMUSCULAR | Status: AC
  Administered 2018-06-23: 5 mL
  Filled 2018-06-23: qty 4
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
## 2018-06-23 NOTE — Discharge Instructions (Signed)
Suture removal in 8 days  °
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
## 2018-06-23 NOTE — ED Notes (Signed)
Pt's left hand soaking in a basin filled with sterile water and betadine solution.

## 2019-07-17 ENCOUNTER — Ambulatory Visit: Payer: Self-pay | Admitting: Neurology

## 2019-07-17 ENCOUNTER — Encounter: Payer: Self-pay | Admitting: Neurology

## 2019-07-17 ENCOUNTER — Other Ambulatory Visit: Payer: Self-pay

## 2019-07-17 ENCOUNTER — Telehealth: Payer: Self-pay | Admitting: Neurology

## 2019-07-17 VITALS — BP 138/70 | HR 77 | Temp 97.7°F | Ht 70.0 in | Wt 156.0 lb

## 2019-07-17 DIAGNOSIS — F419 Anxiety disorder, unspecified: Secondary | ICD-10-CM | POA: Insufficient documentation

## 2019-07-17 DIAGNOSIS — R4020 Unspecified coma: Secondary | ICD-10-CM

## 2019-07-17 DIAGNOSIS — S0990XA Unspecified injury of head, initial encounter: Secondary | ICD-10-CM | POA: Insufficient documentation

## 2019-07-17 NOTE — Progress Notes (Signed)
[facility]  PATIENT: Aaron Levine DOB: [birth year]  REFERRING DOCTOR OR PCP: Elias Else SOURCE: Patient, notes from primary care  _________________________________   HISTORICAL  CHIEF COMPLAINT:  Chief Complaint  Patient presents with  . New Patient (Initial Visit)    RM 12 with fiance, Megan Chrismon (temp: 97.6).  Paper referral from Elias Else, MD for syncope, seizures. Episode occured about a month ago at work. His boss witnessed event and states he was shaking during episode (His boss is his Uncle). No more episdoes since then.     HISTORY OF PRESENT ILLNESS:  I had the pleasure seeing your patient, Aaron Levine, at [facility] for neurologic consultation regarding loss of consciousness and possible seizure activity.  He reports having a fairly normal day at work 06/05/2019.   His uncle felt he likely tripped and fell and then passed out.   He apparently had some jerking.   He was confused for about 10 minutes before returning to his baseline.     He felt the previous 24 hours were typical.   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   Earlier that day, he felt lightheaded and felt he was dehydrated.    He took some water and felt back to baseline.    He had a knot on the front of his right scalp where he hit his head.    He had no headaches, nausea or mental fog afterwards.   He has not had any spells since.   He did have two episodes of being knocked unconscious as a teenager playing sports.  Both times he recovered in under a minute and went back into the game afterwards.     He has anxiety.   Buspirone 15 mg po bid has not helped much and higher dose was not tolerated as well.   Xanax has helped but he sometimes feels he needs a higher dose  and is always worried he will run out of his #30 pils before the end of the month  REVIEW OF SYSTEMS: Constitutional: No fevers, chills, sweats, or change in  appetite Eyes: No visual changes, double vision, eye pain Ear, nose and throat: No hearing loss, ear pain, nasal congestion, sore throat Cardiovascular: No chest pain, palpitations Respiratory: No shortness of breath at rest or with exertion.   No wheezes GastrointestinaI: No nausea, vomiting, diarrhea, abdominal pain, fecal incontinence Genitourinary: No dysuria, urinary retention or frequency.  No nocturia. Musculoskeletal: No neck pain, back pain Integumentary: No rash, pruritus, skin lesions Neurological: as above Psychiatric: No depression at this time.  No anxiety Endocrine: No palpitations, diaphoresis, change in appetite, change in weigh or increased thirst Hematologic/Lymphatic: No anemia, purpura, petechiae. Allergic/Immunologic: No itchy/runny eyes, nasal congestion, recent allergic reactions, rashes  ALLERGIES: Allergies  Allergen Reactions  . Erythromycin   . Penicillins     HOME MEDICATIONS:  Current Outpatient Medications:  .  ALPRAZolam (XANAX) 0.25 MG tablet, Take 0.25 mg by mouth 3 (three) times daily as needed for anxiety., Disp: , Rfl:  .  busPIRone (BUSPAR) 15 MG tablet, Take 15 mg by mouth 2 (two) times daily., Disp: , Rfl:   PAST MEDICAL HISTORY: Past Medical History:  Diagnosis Date  . Allergy   . Anxiety   . Asthma   . Seasonal allergies     PAST SURGICAL HISTORY: Past Surgical History:  Procedure Laterality Date  . DENTAL SURGERY     wisdom teeth removed  .  TYMPANOSTOMY TUBE PLACEMENT      FAMILY HISTORY: History reviewed. No pertinent family history.  SOCIAL HISTORY:  Social History   Socioeconomic History  . Marital status: Married    Spouse name: Not on file  . Number of children: Not on file  . Years of education: Not on file  . Highest education level: Not on file  Occupational History  . Not on file  Tobacco Use  . Smoking status: Current Every Day Smoker    Packs/day: 0.50  . Smokeless tobacco: Current User    Types:  Snuff  . Tobacco comment: uses 3/4 can of dip per day  Substance and Sexual Activity  . Alcohol use: Yes    Alcohol/week: 2.0 standard drinks    Types: 2 Standard drinks or equivalent per week    Comment: Occass- 12 pack per day  . Drug use: No  . Sexual activity: Yes    Birth control/protection: Pill  Other Topics Concern  . Not on file  Social History Narrative   Soda daily (2-3 throughout the day)   Left handed    Lives with fiance   Social Determinants of Health   Financial Resource Strain:   . Difficulty of Paying Living Expenses:   Food Insecurity:   . Worried About Programme researcher, broadcasting/film/video in the Last Year:   . Barista in the Last Year:   Transportation Needs:   . Freight forwarder (Medical):   Marland Kitchen Lack of Transportation (Non-Medical):   Physical Activity:   . Days of Exercise per Week:   . Minutes of Exercise per Session:   Stress:   . Feeling of Stress :   Social Connections:   . Frequency of Communication with Friends and Family:   . Frequency of Social Gatherings with Friends and Family:   . Attends Religious Services:   . Active Member of Clubs or Organizations:   . Attends Banker Meetings:   Marland Kitchen Marital Status:   Intimate Partner Violence:   . Fear of Current or Ex-Partner:   . Emotionally Abused:   Marland Kitchen Physically Abused:   . Sexually Abused:      PHYSICAL EXAM  Vitals:   07/17/19 1015  BP: 138/70  Pulse: 77  Temp: 97.7 F (36.5 C)  SpO2: 98%  Weight: 156 lb (70.8 kg)  Height: 5\' 10"  (1.778 m)    Body mass index is 22.38 kg/m.   General: The patient is well-developed and well-nourished and in no acute distress  HEENT:  Head is /AT.  Sclera are anicteric.  Funduscopic exam shows normal optic discs and retinal vessels.  Neck: No carotid bruits are noted.  The neck is nontender.  Cardiovascular: The heart has a regular rate and rhythm with a normal S1 and S2. There were no murmurs, gallops or rubs.    Skin:  Extremities are without rash or  edema.   Neurologic Exam  Mental status: The patient is alert and oriented x 3 at the time of the examination. The patient has apparent normal recent and remote memory, with an apparently normal attention span and concentration ability.   Speech is normal.  Cranial nerves: Extraocular movements are full. Pupils are equal, round, and reactive to light and accomodation.  Visual fields are full.  Facial symmetry is present. There is good facial sensation to soft touch bilaterally.Facial strength is normal.  Trapezius and sternocleidomastoid strength is normal. Motor:  Muscle bulk is normal.   Tone is  normal. Strength is  5 / 5 in all 4 extremities.   Sensory: Sensory testing is intact to pinprick, soft touch and vibration sensation in all 4 extremities.  Coordination: Cerebellar testing reveals good finger-nose-finger and heel-to-shin bilaterally.  Gait and station: Station is normal.   Gait is normal. Tandem gait is normal. Romberg is negative.   Reflexes: Deep tendon reflexes are symmetric and normal bilaterally.        ASSESSMENT AND PLAN  Loss of consciousness ([place]) - Plan: EEG adult  Injury of head, initial encounter  Anxiety    In summary, Mr. Feild is a 34 year old woman who had a single episode of loss of consciousness about 5 to 6 weeks ago that was associated with grogginess for about 5 to 10 minutes afterwards.  Most likely, he tripped, fell and hit his head with associated loss of consciousness (he did have a small knot on the right frontal scalp).  His uncle noted that he had some jerking his limbs right after he went down.  Therefore I cannot rule out the possibility of seizure activity (versus a few jerks due to hitting his head and losing consciousness).  We will check an EEG.  If another episode occurs we will need to consider an antiepileptic drug and also check an MRI of the brain.  A second problem is anxiety.  He reports he is not  getting any benefit from BuSpar and relies on intermittent alprazolam.  Higher doses of BuSpar because reduced libido and he is not wanting to try SSRIs.  The EEG is normal, consider low-dose generic Abilify.  Could also consider Cymbalta as it has less sexual dysfunction than SSRIs.  Trintellix could be another option but he does not have insurance and the drug is pricey.  He is advised to discuss further at his next primary care appointment.  He will return to see me as needed if he has new or worsening symptoms.  Klark A. Felecia Shelling, MD, [facility] 06/23/5186, 4:16 PM Certified in Neurology, Clinical Neurophysiology, Sleep Medicine and Neuroimaging  [facility] [address], [place],  [address] [phone]

## 2019-07-17 NOTE — Telephone Encounter (Signed)
Pt has called asking for a note for work today.  Pt is asking if RN can have the note faxed to his mothers job which is [phone] attention Cly[employer](which is his mother)

## 2019-08-11 ENCOUNTER — Encounter: Payer: Self-pay | Admitting: Neurology

## 2019-08-11 ENCOUNTER — Other Ambulatory Visit: Payer: Self-pay

## 2019-11-25 ENCOUNTER — Ambulatory Visit
Admission: EM | Admit: 2019-11-25 | Discharge: 2019-11-25 | Disposition: A | Discharge status: Home / Self Care | Payer: Self-pay | Attending: Emergency Medicine | Admitting: Emergency Medicine

## 2019-11-25 ENCOUNTER — Other Ambulatory Visit: Payer: Self-pay

## 2019-11-25 NOTE — ED Triage Notes (Signed)
covid exposure ---- no symptoms  

## 2019-11-27 LAB — NOVEL CORONAVIRUS, NAA: SARS-CoV-2, NAA: NOT DETECTED

## 2020-06-23 ENCOUNTER — Other Ambulatory Visit: Payer: Self-pay

## 2020-06-23 ENCOUNTER — Encounter (HOSPITAL_COMMUNITY): Payer: Self-pay

## 2020-06-23 ENCOUNTER — Emergency Department (HOSPITAL_COMMUNITY)
Admission: EM | Admit: 2020-06-23 | Discharge: 2020-06-23 | Disposition: A | Discharge status: Home / Self Care | Payer: Self-pay | Attending: Emergency Medicine | Admitting: Emergency Medicine

## 2020-06-23 ENCOUNTER — Emergency Department (HOSPITAL_COMMUNITY): Payer: Self-pay

## 2020-06-23 DIAGNOSIS — J45909 Unspecified asthma, uncomplicated: Secondary | ICD-10-CM | POA: Insufficient documentation

## 2020-06-23 DIAGNOSIS — S81831A Puncture wound without foreign body, right lower leg, initial encounter: Secondary | ICD-10-CM | POA: Insufficient documentation

## 2020-06-23 DIAGNOSIS — F17210 Nicotine dependence, cigarettes, uncomplicated: Secondary | ICD-10-CM | POA: Insufficient documentation

## 2020-06-23 DIAGNOSIS — Z79899 Other long term (current) drug therapy: Secondary | ICD-10-CM | POA: Insufficient documentation

## 2020-06-23 DIAGNOSIS — W6191XA Bitten by other birds, initial encounter: Secondary | ICD-10-CM | POA: Insufficient documentation

## 2020-06-23 DIAGNOSIS — Y9273 Farm field as the place of occurrence of the external cause: Secondary | ICD-10-CM | POA: Insufficient documentation

## 2020-06-23 MED ORDER — DOXYCYCLINE HYCLATE 100 MG PO CAPS: 100.0000 mg | ORAL_CAPSULE | Freq: Two times a day (BID) | ORAL | 0 refills | Status: AC

## 2020-06-23 NOTE — ED Notes (Signed)
Patient transported to X-ray 

## 2020-06-23 NOTE — ED Triage Notes (Signed)
Patient complains of right knee pain after being spurred by his chicken yesterday, pain when he bends his knee, small puncture noted

## 2020-06-23 NOTE — Discharge Instructions (Addendum)
You have a puncture wound on your leg,  started you on antibiotics please take as prescribed.  I recommend keeping the area clean, please rinse out the wound twice a day for the next 7 days.  You may also apply antiseptic ointment on it as needed.  For pain I recommend over-the-counter pain medications like ibuprofen and/or Tylenol every 6 hours as needed please follow discharging back of bottle  Like you to follow-up with your PCP in 1 week's time for reevaluation of the wound.  Come back to the emergency department if you develop chest pain, shortness of breath, severe abdominal pain, uncontrolled nausea, vomiting, diarrhea.

## 2020-06-23 NOTE — ED Provider Notes (Signed)
[facility] EMERGENCY DEPARTMENT Provider Note   CSN: [id number] Arrival date & time: 06/23/20  1136     History No chief complaint on file.   Aaron Levine is a 35 y.o. male.  HPI   Patient with no significant medical history presents to the emergency department with chief complaint of puncture wound to his right leg.  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 The Roosters talents spurred him in his right lower leg, he denies hitting his head, losing conscious, is not on anticoagulant.  He is not immunocompromise, is up-to-date on his tetanus shot.  He endorses that he has worsening pain in that area, and with increasing redness, denies any sort of drainage or discharge, denies paresthesia or weakness in the lower leg.  Denies alleviating factors.  Patient denies headaches, fevers, chills, shortness breath, chest pain, dumping, nausea, vomiting diarrhea, worsening pedal edema.  Past Medical History:  Diagnosis Date  . Allergy   . Anxiety   . Asthma   . Seasonal allergies     Patient Active Problem List   Diagnosis Date Noted  . Loss of consciousness (HCC) 07/17/2019  . Head injury 07/17/2019  . Anxiety 07/17/2019    Past Surgical History:  Procedure Laterality Date  . DENTAL SURGERY     wisdom teeth removed  . TYMPANOSTOMY TUBE PLACEMENT         No family history on file.  Social History   Tobacco Use  . Smoking status: Current Every Day Smoker    Packs/day: 0.50  . Smokeless tobacco: Current User    Types: Snuff  . Tobacco comment: uses 3/4 can of dip per day  Substance Use Topics  . Alcohol use: Yes    Alcohol/week: 2.0 standard drinks    Types: 2 Standard drinks or equivalent per week    Comment: Occass- 12 pack per day  . Drug use: No    Home Medications Prior to Admission medications   Medication Sig Start Date End Date Taking? Authorizing Provider   doxycycline (VIBRAMYCIN) 100 MG capsule Take 1 capsule (100 mg total) by mouth 2 (two) times daily for 7 days. 06/23/20 06/30/20 Yes Carroll Sage, PA-C  ALPRAZolam Prudy Feeler) 0.25 MG tablet Take 0.25 mg by mouth 3 (three) times daily as needed for anxiety.    [provider]  busPIRone (BUSPAR) 15 MG tablet Take 15 mg by mouth 2 (two) times daily.    [provider]    Allergies    Erythromycin and Penicillins  Review of Systems   Review of Systems  Constitutional: Negative for chills and fever.  HENT: Negative for congestion.   Respiratory: Negative for shortness of breath.   Cardiovascular: Negative for chest pain.  Gastrointestinal: Negative for abdominal pain.  Genitourinary: Negative for enuresis.  Musculoskeletal: Negative for back pain.  Skin: Positive for wound. Negative for rash.       Puncture wound of the right lower leg  Neurological: Negative for dizziness and headaches.  Hematological: Does not bruise/bleed easily.    Physical Exam Updated Vital Signs BP (!) 146/110 (BP Location: Right Arm)   Pulse 95   Temp 98.3 F (36.8 C)   Resp 16   SpO2 96%   Physical Exam Vitals and nursing note reviewed.  Constitutional:      General: He is not in acute distress.    Appearance: Normal appearance. He  is not ill-appearing or diaphoretic.  HENT:     Head: Normocephalic and atraumatic.     Nose: No congestion.  Eyes:     Conjunctiva/sclera: Conjunctivae normal.  Cardiovascular:     Rate and Rhythm: Normal rate and regular rhythm.  Pulmonary:     Effort: Pulmonary effort is normal.     Breath sounds: Normal breath sounds.  Musculoskeletal:        General: Tenderness present.     Cervical back: Neck supple.     Right lower leg: No edema.     Left lower leg: No edema.     Comments: Patient's right lower leg was visualized, he has a noted puncture wound in the proximal one third of the tibia, it had surrounding erythema, no drainage or discharge  present.  It is warm to the touch, tender to palpation, no fluctuant indurations present.  He had full range of motion his ankles and knee.  Skin:    General: Skin is warm and dry.     Coloration: Skin is not jaundiced or pale.  Neurological:     Mental Status: He is alert and oriented to person, place, and time.  Psychiatric:        Mood and Affect: Mood normal.     ED Results / Procedures / Treatments   Labs (all labs ordered are listed, but only abnormal results are displayed) Labs Reviewed - No data to display  EKG None  Radiology DG Tibia/Fibula Right  Result Date: 06/23/2020 CLINICAL DATA:  Puncture wound on anterior aspect of proximal third of the tibia. EXAM: RIGHT TIBIA AND FIBULA - 2 VIEW COMPARISON:  None. FINDINGS: Fiducial marker was placed over the area of concern. No underlying fracture or dislocation identified. No radiopaque foreign bodies or soft tissue calcifications. IMPRESSION: 1. No acute findings. Electronically Signed   By: Signa Kell M.D.   On: 06/23/2020 13:24    Procedures Procedures   Medications Ordered in ED Medications - No data to display  ED Course  I have reviewed the triage vital signs and the nursing notes.  Pertinent labs & imaging results that were available during my care of the patient were reviewed by me and considered in my medical decision making (see chart for details).    MDM Rules/Calculators/A&P                         Initial impression-patient presents with puncture of the right lower leg.  He is alert, does not appear in acute distress, vital signs reassuring.  Will obtain imaging for rule out of foreign body.   Due to small size of puncture wound and increased risk of infection will recommend against closing, patient was agreeable to this.  Work-up-x-rays unremarkable  Rule out- Low suspicion for fracture,  dislocation or foreign body as x-ray does not reveal any acute findings. low suspicion for ligament or tendon  damage as area was palpated no gross defects noted, he has full range of motion in his right lower leg. low suspicion for compartment syndrome as area was palpated it was soft to the touch, neurovascular fully intact before and after the procedure. low  Suspicion for deep tissue infection as there is no fluctuant or induration present.  Plan-patient has a small puncture wound on his right lower leg, will start him on antibiotics, recommend basic wound care, follow-up with PCP in 1 week's time for reevaluation.  Vital signs have remained stable, no  indication for hospital admission.  Patient given at home care as well strict return precautions.  Patient verbalized that they understood agreed to said plan.   Final Clinical Impression(s) / ED Diagnoses Final diagnoses:  Puncture wound of right lower leg, initial encounter    Rx / DC Orders ED Discharge Orders         Ordered    doxycycline (VIBRAMYCIN) 100 MG capsule  2 times daily        06/23/20 1330           Barnie Del 06/23/20 1332    Maia Plan, MD 06/23/20 1907

## 2020-08-24 ENCOUNTER — Ambulatory Visit: Payer: Self-pay

## 2020-08-24 ENCOUNTER — Ambulatory Visit
Admission: EM | Admit: 2020-08-24 | Discharge: 2020-08-24 | Disposition: A | Discharge status: Home / Self Care | Payer: Self-pay | Attending: Family Medicine | Admitting: Family Medicine

## 2020-08-24 ENCOUNTER — Encounter: Payer: Self-pay | Admitting: Emergency Medicine

## 2020-08-24 DIAGNOSIS — S91311A Laceration without foreign body, right foot, initial encounter: Secondary | ICD-10-CM

## 2020-08-24 DIAGNOSIS — M79671 Pain in right foot: Secondary | ICD-10-CM

## 2020-08-24 MED ORDER — DOXYCYCLINE HYCLATE 100 MG PO CAPS: 100.0000 mg | ORAL_CAPSULE | Freq: Two times a day (BID) | ORAL | 0 refills | Status: DC

## 2020-08-24 MED ORDER — TETANUS-DIPHTH-ACELL PERTUSSIS 5-2.5-18.5 LF-MCG/0.5 IM SUSY: 0.5000 mL | PREFILLED_SYRINGE | Freq: Once | INTRAMUSCULAR | Status: DC

## 2020-08-24 NOTE — ED Provider Notes (Signed)
[facility]    CSN: [id number] Arrival date & time: 08/24/20  1213      History   Chief Complaint Chief Complaint  Patient presents with  . Laceration    HPI Aaron Levine is a 35 y.o. male.   Reports that he caught the ball of his right foot yesterday morning about 11 AM.  States that he was at [facility] and is unsure about what he stepped on.  Has had Tdap vaccine 2 years ago.  Reports that the area is tender when he bears weight, swollen and mildly erythematous.  Has not attempted OTC treatment.  States that he needs a note for work because it is painful to walk on.  Denies drainage, foreign body, bleeding from the area, chills, fever, abdominal pain, other symptoms.  ROS per HPI  The history is provided by the patient.  Laceration   Past Medical History:  Diagnosis Date  . Allergy   . Anxiety   . Asthma   . Seasonal allergies     Patient Active Problem List   Diagnosis Date Noted  . Loss of consciousness (HCC) 07/17/2019  . Head injury 07/17/2019  . Anxiety 07/17/2019    Past Surgical History:  Procedure Laterality Date  . DENTAL SURGERY     wisdom teeth removed  . TYMPANOSTOMY TUBE PLACEMENT         Home Medications    Prior to Admission medications   Medication Sig Start Date End Date Taking? Authorizing Provider  doxycycline (VIBRAMYCIN) 100 MG capsule Take 1 capsule (100 mg total) by mouth 2 (two) times daily. 08/24/20  Yes Moshe Cipro, NP  ALPRAZolam Prudy Feeler) 0.25 MG tablet Take 0.25 mg by mouth 3 (three) times daily as needed for anxiety.    [provider]  busPIRone (BUSPAR) 15 MG tablet Take 15 mg by mouth 2 (two) times daily.    [provider]    Family History No family history on file.  Social History Social History   Tobacco Use  . Smoking status: Current Every Day Smoker    Packs/day: 0.50  . Smokeless tobacco: Current User    Types: Snuff  . Tobacco comment: uses 3/4 can of dip per  day  Substance Use Topics  . Alcohol use: Yes    Alcohol/week: 2.0 standard drinks    Types: 2 Standard drinks or equivalent per week    Comment: Occass- 12 pack per day  . Drug use: No     Allergies   Erythromycin and Penicillins   Review of Systems Review of Systems   Physical Exam Triage Vital Signs ED Triage Vitals  Enc Vitals Group     BP 08/24/20 1344 128/87     Pulse Rate 08/24/20 1344 83     Resp 08/24/20 1344 17     Temp 08/24/20 1344 98 F (36.7 C)     Temp Source 08/24/20 1344 Oral     SpO2 08/24/20 1344 97 %     Weight --      Height --      Head Circumference --      Peak Flow --      Pain Score 08/24/20 1342 4     Pain Loc --      Pain Edu? --      Excl. in GC? --    No data found.  Updated Vital Signs BP 128/87 (BP Location: Right Arm)   Pulse 83   Temp 98 F (  36.7 C) (Oral)   Resp 17   SpO2 97%   Visual Acuity Right Eye Distance:   Left Eye Distance:   Bilateral Distance:    Right Eye Near:   Left Eye Near:    Bilateral Near:     Physical Exam Vitals and nursing note reviewed.  Constitutional:      Appearance: Normal appearance. He is well-developed and normal weight.  HENT:     Head: Normocephalic and atraumatic.     Nose: Nose normal.     Mouth/Throat:     Mouth: Mucous membranes are moist.     Pharynx: Oropharynx is clear.  Eyes:     Extraocular Movements: Extraocular movements intact.     Conjunctiva/sclera: Conjunctivae normal.     Pupils: Pupils are equal, round, and reactive to light.  Cardiovascular:     Rate and Rhythm: Normal rate and regular rhythm.  Pulmonary:     Effort: Pulmonary effort is normal. No respiratory distress.  Musculoskeletal:        General: Normal range of motion.     Cervical back: Normal range of motion and neck supple.  Skin:    General: Skin is warm and dry.     Capillary Refill: Capillary refill takes less than 2 seconds.     Findings: Laceration present.     Comments: 2 similar  laceration to the ball of the right foot.  Area surrounding the laceration is erythematous, swollen, tender to touch.  No drainage, no bleeding, no foreign bodies appreciated  Neurological:     General: No focal deficit present.     Mental Status: He is alert and oriented to person, place, and time.  Psychiatric:        Mood and Affect: Mood normal.        Behavior: Behavior normal.        Thought Content: Thought content normal.      UC Treatments / Results  Labs (all labs ordered are listed, but only abnormal results are displayed) Labs Reviewed - No data to display  EKG   Radiology No results found.  Procedures Procedures (including critical care time)  Medications Ordered in UC Medications - No data to display  Initial Impression / Assessment and Plan / UC Course  I have reviewed the triage vital signs and the nursing notes.  Pertinent labs & imaging results that were available during my care of the patient were reviewed by me and considered in my medical decision making (see chart for details).    Right foot pain Laceration to the right foot on plantar surface  Doxycycline prescribed 100 mg twice daily x7 days Work note provided May soak the foot as needed Follow up with this office or with primary care for increased swelling, redness, tenderness, warmth, drainage from the area.  Follow up in the ER for red streaking up your leg, high fever, trouble swallowing, trouble breathing, other concerning symptoms.    Final Clinical Impressions(s) / UC Diagnoses   Final diagnoses:  Right foot pain  Laceration of plantar aspect of right foot, initial encounter     Discharge Instructions     We have updated your Tdap vaccine in the office today  I have sent in doxycycline for you to take one tablet twice a day for 10 days  Follow-up as needed    ED Prescriptions    Medication Sig Dispense Auth. Provider   doxycycline (VIBRAMYCIN) 100 MG capsule Take 1  capsule (100 mg total)  by mouth 2 (two) times daily. 14 capsule Moshe Cipro, NP     PDMP not reviewed this encounter.   Moshe Cipro, NP 08/24/20 1442

## 2020-08-24 NOTE — Discharge Instructions (Signed)
We have updated your Tdap vaccine in the office today  I have sent in doxycycline for you to take one tablet twice a day for 10 days  Follow-up as needed

## 2020-08-24 NOTE — ED Triage Notes (Signed)
Cut the bottom of RT foot yesterday morning around 11am stepping off a boat.

## 2020-08-25 ENCOUNTER — Telehealth: Payer: Self-pay | Admitting: Family Medicine

## 2020-08-25 DIAGNOSIS — S91311A Laceration without foreign body, right foot, initial encounter: Secondary | ICD-10-CM

## 2020-08-25 MED ORDER — CEPHALEXIN 500 MG PO CAPS: 500.0000 mg | ORAL_CAPSULE | Freq: Two times a day (BID) | ORAL | 0 refills | Status: AC

## 2020-08-25 NOTE — Telephone Encounter (Signed)
Doxycycline is upsetting his stomach. Stop doxy. Start Keflex 500mg  BID x 7 days

## 2020-09-28 ENCOUNTER — Ambulatory Visit (INDEPENDENT_AMBULATORY_CARE_PROVIDER_SITE_OTHER): Payer: Self-pay

## 2020-09-28 ENCOUNTER — Ambulatory Visit
Admission: EM | Admit: 2020-09-28 | Discharge: 2020-09-28 | Disposition: A | Discharge status: Home / Self Care | Payer: Self-pay | Attending: Family Medicine | Admitting: Family Medicine

## 2020-09-28 ENCOUNTER — Other Ambulatory Visit: Payer: Self-pay

## 2020-09-28 DIAGNOSIS — S9782XA Crushing injury of left foot, initial encounter: Secondary | ICD-10-CM

## 2020-09-28 DIAGNOSIS — S92425A Nondisplaced fracture of distal phalanx of left great toe, initial encounter for closed fracture: Secondary | ICD-10-CM

## 2020-09-28 MED ORDER — NAPROXEN 375 MG PO TABS: 375.0000 mg | ORAL_TABLET | Freq: Two times a day (BID) | ORAL | 0 refills | Status: AC

## 2020-09-28 MED ORDER — SULFAMETHOXAZOLE-TRIMETHOPRIM 800-160 MG PO TABS: 1.0000 | ORAL_TABLET | Freq: Two times a day (BID) | ORAL | 0 refills | Status: DC

## 2020-09-28 NOTE — ED Triage Notes (Signed)
Pt presents with left foot injury from dropping equipment on foot

## 2020-09-28 NOTE — ED Provider Notes (Signed)
[facility]    CSN: [id number] Arrival date & time: 09/28/20  1849      History   Chief Complaint Chief Complaint  Patient presents with   Foot Injury    HPI Aaron Levine is a 35 y.o. male.   HPI Patient presents today for evaluation possible left foot fracture.  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  He also avulsed skin on the great toe and the second toe.  He has had diffuse swelling and pain with ambulation since injury occurred.  He also has some discharge and drainage from the great toe at the nail bed.  Past Medical History:  Diagnosis Date   Allergy    Anxiety    Asthma    Seasonal allergies     Patient Active Problem List   Diagnosis Date Noted   Loss of consciousness (HCC) 07/17/2019   Head injury 07/17/2019   Anxiety 07/17/2019    Past Surgical History:  Procedure Laterality Date   DENTAL SURGERY     wisdom teeth removed   TYMPANOSTOMY TUBE PLACEMENT         Home Medications    Prior to Admission medications   Medication Sig Start Date End Date Taking? Authorizing Provider  ALPRAZolam (XANAX) 0.25 MG tablet Take 0.25 mg by mouth 3 (three) times daily as needed for anxiety.    [provider]  busPIRone (BUSPAR) 15 MG tablet Take 15 mg by mouth 2 (two) times daily.    [provider]  doxycycline (VIBRAMYCIN) 100 MG capsule Take 1 capsule (100 mg total) by mouth 2 (two) times daily. 08/24/20   Moshe Cipro, NP    Family History History reviewed. No pertinent family history.  Social History Social History   Tobacco Use   Smoking status: Every Day    Packs/day: 0.50    Pack years: 0.00    Types: Cigarettes   Smokeless tobacco: Current    Types: Snuff   Tobacco comments:    uses 3/4 can of dip per day  Substance Use Topics   Alcohol use: Yes    Alcohol/week: 2.0 standard drinks    Types: 2 Standard drinks or equivalent per week    Comment: Occass- 12  pack per day   Drug use: No     Allergies   Erythromycin and Penicillins   Review of Systems Review of Systems Pertinent negatives listed in HPI    Physical Exam Triage Vital Signs ED Triage Vitals  Enc Vitals Group     BP 09/28/20 1901 (!) 138/101     Pulse Rate 09/28/20 1901 72     Resp 09/28/20 1901 20     Temp 09/28/20 1901 98.2 F (36.8 C)     Temp src --      SpO2 09/28/20 1901 97 %     Weight --      Height --      Head Circumference --      Peak Flow --      Pain Score 09/28/20 1859 7     Pain Loc --      Pain Edu? --      Excl. in GC? --    No data found.  Updated Vital Signs BP (!) 138/101   Pulse 72   Temp 98.2 F (36.8 C)   Resp 20   SpO2 97%   Visual Acuity Right Eye Distance:   Left  Eye Distance:   Bilateral Distance:    Right Eye Near:   Left Eye Near:    Bilateral Near:     Physical Exam Constitutional:      Appearance: Normal appearance.  HENT:     Head: Normocephalic.  Cardiovascular:     Rate and Rhythm: Normal rate and regular rhythm.  Pulmonary:     Effort: Pulmonary effort is normal.     Breath sounds: Normal breath sounds.  Musculoskeletal:     Left foot: Normal capillary refill. Swelling, tenderness and bony tenderness present.       Legs:  Skin:    Capillary Refill: Capillary refill takes less than 2 seconds.  Neurological:     Mental Status: He is alert.  Psychiatric:        Mood and Affect: Mood normal.   UC Treatments / Results  Labs (all labs ordered are listed, but only abnormal results are displayed) Labs Reviewed - No data to display  EKG   Radiology DG Foot Complete Left  Addendum Date: 09/28/2020   ADDENDUM REPORT: 09/28/2020 19:37 ADDENDUM: These results were called by telephone at the time of interpretation on 09/28/2020 at 7:36 pm to provider [facility] , who verbally acknowledged these results. Electronically Signed   By: Donzetta Kohut M.D.   On: 09/28/2020 19:37   Result Date:  09/28/2020 CLINICAL DATA:  Injury to the LEFT foot, dropped equipment on LEFT foot. EXAM: LEFT FOOT - COMPLETE 3+ VIEW COMPARISON:  None FINDINGS: Question soft tissue swelling about the great toe. Mild cortical disruption at the base of the proximal phalanx of the great toe versus is old injury or degenerative change. Best seen on oblique view. Midfoot degenerative changes. No additional signs of injury. No significant soft tissue swelling. IMPRESSION: Soft tissue swelling about the base of the proximal phalanx of the great toe with question of cortical disruption that may extend into the joint. This may be a sequela of prior injury. Correlate with any point tenderness in this area. Electronically Signed: By: Donzetta Kohut M.D. On: 09/28/2020 19:22    Procedures Procedures (including critical care time)  Medications Ordered in UC Medications - No data to display  Initial Impression / Assessment and Plan / UC Course  I have reviewed the triage vital signs and the nursing notes.  Pertinent labs & imaging results that were available during my care of the patient were reviewed by me and considered in my medical decision making (see chart for details).  Closed nondisplaced fracture involving the distal phalanx of the left great toe questionable avulsion fracture involving the second left toe at medial lateral distal region of toe.  Patient placed in a postop shoe work note provided as he is on his feet throughout his shifts excusing him from work for the next 3 days.  Advised to follow-up with orthopedic for further evaluation to ensure appropriate healing of fracture  Final Clinical Impressions(s) / UC Diagnoses   Final diagnoses:  Open nondisplaced fracture of distal phalanx of left great toe, initial encounter  Crushing injury of left foot, initial encounter   Discharge Instructions   None    ED Prescriptions     Medication Sig Dispense Auth. Provider   sulfamethoxazole-trimethoprim  (BACTRIM DS) 800-160 MG tablet Take 1 tablet by mouth 2 (two) times daily. 10 tablet Bing Neighbors, FNP   naproxen (NAPROSYN) 375 MG tablet Take 1 tablet (375 mg total) by mouth 2 (two) times daily. 20 tablet Joaquin Courts  S, FNP      PDMP not reviewed this encounter.   Bing Neighbors, FNP 09/28/20 2112

## 2020-09-30 ENCOUNTER — Telehealth: Payer: Self-pay | Admitting: Orthopedic Surgery

## 2020-09-30 NOTE — Telephone Encounter (Signed)
Patient called to relay that he was treated at [facility] in [facility] 09/28/20 for problem of Crush injury of left foot. I offered appointment in cancellation slot but patient's concern is self-pay/no insurance. Discussed and encouraged to schedule per urgent care provider's referral. Also patient was given only 3 days out of work. Please review and advise regarding scheduling.

## 2020-09-30 NOTE — Telephone Encounter (Signed)
Called back to patient. Relayed. °

## 2020-10-04 ENCOUNTER — Other Ambulatory Visit: Payer: Self-pay

## 2020-10-04 ENCOUNTER — Encounter: Payer: Self-pay | Admitting: Orthopedic Surgery

## 2020-10-04 ENCOUNTER — Ambulatory Visit: Payer: Self-pay | Admitting: Orthopedic Surgery

## 2020-10-04 VITALS — BP 133/97 | HR 79 | Ht 71.0 in | Wt 151.8 lb

## 2020-10-04 DIAGNOSIS — S99921A Unspecified injury of right foot, initial encounter: Secondary | ICD-10-CM

## 2020-10-04 DIAGNOSIS — S92405A Nondisplaced unspecified fracture of left great toe, initial encounter for closed fracture: Secondary | ICD-10-CM

## 2020-10-04 NOTE — Progress Notes (Signed)
NEW PROBLEM//OFFICE VISIT  Summary assessment and plan:   Fracture right toe second toe both distal phalangeal fractures  Hard soled shoe  Weeks out of work  X-ray in 3 weeks  Continue Naprosyn and antibiotics  Chief Complaint  Patient presents with   Foot Injury    Fx/crush injury/dropped a piece of farm equipment on it/DOI 09/27/20   35 year old male dropped a heavy piece of equipment on his left foot had a crush injury with a great toe fracture and a second toe fracture both involving the distal phalange ease both nondisplaced patient has no history of prior injury was put on Naprosyn and antibiotics  Complains of pain over the great toe  Patient treated with postop shoe    Current Outpatient Medications:    ALPRAZolam (XANAX) 0.25 MG tablet, Take 0.25 mg by mouth 3 (three) times daily as needed for anxiety., Disp: , Rfl:    busPIRone (BUSPAR) 5 MG tablet, Take 5 mg by mouth every morning., Disp: , Rfl:    naproxen (NAPROSYN) 375 MG tablet, Take 1 tablet (375 mg total) by mouth 2 (two) times daily., Disp: 20 tablet, Rfl: 0   sulfamethoxazole-trimethoprim (BACTRIM DS) 800-160 MG tablet, Take 1 tablet by mouth 2 (two) times daily., Disp: 10 tablet, Rfl: 0  HPI Patient presents today for evaluation possible left foot fracture.  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  He also avulsed skin on the great toe and the second toe.  He has had diffuse swelling and pain with ambulation since injury occurred.  He also has some discharge and drainage from the great toe at the nail bed.    A.  Encounter Diagnoses  Name Primary?   Nondisplaced unspecified fracture of left great toe, initial encounter for closed fracture Yes   Injury of small toe, right, initial encounter     B. DATA ANALYSED:   IMAGING: Interpretation of images: External imaging shows a small hairline fracture at the base of the distal phalanx of the great toe as well as  the same at the base of the distal phalanx of the second digit  Orders: None  Outside records reviewed: ER   C. MANAGEMENT   Hard soled shoe continue naproxen and antibiotic  Out of work 3 weeks  May call back if he gets better to return to work  No orders of the defined types were placed in this encounter.    BP (!) 133/97   Pulse 79   Ht 5\' 11"  (1.803 m)   Wt 151 lb 12.8 oz (68.9 kg)   BMI 21.17 kg/m    General appearance: Well-developed well-nourished no gross deformities  Cardiovascular normal pulse and perfusion normal color without edema  Neurologically no sensation loss or deficits or pathologic reflexes  Psychological: Awake alert and oriented x3 mood and affect normal  Skin no lacerations or ulcerations no nodularity no palpable masses, no erythema or nodularity  Musculoskeletal:   Left great toe tenderness distal phalanx second digit distal phalanx also tender no signs of infection mild swelling   ROS  No fever no drainage   Past Medical History:  Diagnosis Date   Allergy    Anxiety    Asthma    Seasonal allergies     Past Surgical History:  Procedure Laterality Date   DENTAL SURGERY     wisdom teeth removed   TYMPANOSTOMY TUBE PLACEMENT      No family history  on file. Social History   Tobacco Use   Smoking status: Every Day    Packs/day: 0.50    Pack years: 0.00    Types: Cigarettes   Smokeless tobacco: Current    Types: Snuff   Tobacco comments:    uses 3/4 can of dip per day  Substance Use Topics   Alcohol use: Yes    Alcohol/week: 2.0 standard drinks    Types: 2 Standard drinks or equivalent per week    Comment: Occass- 12 pack per day   Drug use: No    Allergies  Allergen Reactions   Erythromycin    Penicillins     Current Meds  Medication Sig   ALPRAZolam (XANAX) 0.25 MG tablet Take 0.25 mg by mouth 3 (three) times daily as needed for anxiety.   busPIRone (BUSPAR) 5 MG tablet Take 5 mg by mouth every morning.    naproxen (NAPROSYN) 375 MG tablet Take 1 tablet (375 mg total) by mouth 2 (two) times daily.   sulfamethoxazole-trimethoprim (BACTRIM DS) 800-160 MG tablet Take 1 tablet by mouth 2 (two) times daily.        Fuller Canada, MD  10/04/2020 2:07 PM

## 2020-10-04 NOTE — Patient Instructions (Signed)
Wear hard sole shoe until pain is gone   Notes: for 3 weeks OOW

## 2020-10-25 ENCOUNTER — Ambulatory Visit (INDEPENDENT_AMBULATORY_CARE_PROVIDER_SITE_OTHER): Payer: Self-pay | Admitting: Orthopedic Surgery

## 2020-10-25 ENCOUNTER — Ambulatory Visit: Payer: Self-pay

## 2020-10-25 ENCOUNTER — Other Ambulatory Visit: Payer: Self-pay

## 2020-10-25 ENCOUNTER — Encounter: Payer: Self-pay | Admitting: Orthopedic Surgery

## 2020-10-25 DIAGNOSIS — M25461 Effusion, right knee: Secondary | ICD-10-CM

## 2020-10-25 DIAGNOSIS — S92415D Nondisplaced fracture of proximal phalanx of left great toe, subsequent encounter for fracture with routine healing: Secondary | ICD-10-CM

## 2020-10-25 MED ORDER — TRAMADOL-ACETAMINOPHEN 37.5-325 MG PO TABS: 1.0000 | ORAL_TABLET | Freq: Four times a day (QID) | ORAL | 0 refills | Status: AC | PRN

## 2020-10-25 MED ORDER — PREDNISONE 10 MG PO TABS: 10.0000 mg | ORAL_TABLET | Freq: Three times a day (TID) | ORAL | 0 refills | Status: AC

## 2020-10-25 NOTE — Patient Instructions (Addendum)
Soak 3 x a day for 20 min in warem water and epsom salt  OOW x 3 weeks

## 2020-10-25 NOTE — Progress Notes (Signed)
FOLLOW UP   Encounter Diagnoses  Name Primary?   Closed nondisplaced fracture of proximal phalanx of left great toe with routine healing, subsequent encounter    Swelling of joint of right knee Yes     Chief Complaint  Patient presents with   Follow-up    Recheck on left great toe, DOI 09/27/20     35 year old male crush injury left great toe still having pain and swelling  X-rays today show fracture is healing  Exam is tenderness and swelling in the area of injury over the left great toe  Recommend soaks 3 times a day with Epson salt  It is hard for him to get a shoe on so we can send him back to work and I will see him in 3 weeks

## 2020-11-15 ENCOUNTER — Encounter: Payer: Self-pay | Admitting: Orthopedic Surgery

## 2020-11-15 ENCOUNTER — Ambulatory Visit (INDEPENDENT_AMBULATORY_CARE_PROVIDER_SITE_OTHER): Payer: Self-pay | Admitting: Orthopedic Surgery

## 2020-11-15 ENCOUNTER — Other Ambulatory Visit: Payer: Self-pay

## 2020-11-15 VITALS — BP 152/105 | HR 86 | Ht 71.0 in | Wt 150.2 lb

## 2020-11-15 DIAGNOSIS — S92415D Nondisplaced fracture of proximal phalanx of left great toe, subsequent encounter for fracture with routine healing: Secondary | ICD-10-CM

## 2020-11-15 NOTE — Patient Instructions (Signed)
Give RTW

## 2020-11-15 NOTE — Progress Notes (Signed)
Chief Complaint  Patient presents with   Toe Injury    Great toe injury/crush injury//DOI 09/27/20   C/o pain 2nd toe more than first   He also has a distal phal fx 2nd toe  Aaron Levine is ready to go back to work swelling is down but his second toe still hurts more than the first.  Swelling is gone down there is still some tenderness in both digits  Recommend routine activities hard soled shoe for support follow-up as needed

## 2021-01-14 ENCOUNTER — Other Ambulatory Visit: Payer: Self-pay

## 2021-01-14 ENCOUNTER — Ambulatory Visit
Admission: EM | Admit: 2021-01-14 | Discharge: 2021-01-14 | Disposition: A | Discharge status: Home / Self Care | Payer: Self-pay | Attending: Family Medicine | Admitting: Family Medicine

## 2021-01-14 DIAGNOSIS — J069 Acute upper respiratory infection, unspecified: Secondary | ICD-10-CM

## 2021-01-14 DIAGNOSIS — J3089 Other allergic rhinitis: Secondary | ICD-10-CM

## 2021-01-14 DIAGNOSIS — J4521 Mild intermittent asthma with (acute) exacerbation: Secondary | ICD-10-CM

## 2021-01-14 MED ORDER — PROMETHAZINE-DM 6.25-15 MG/5ML PO SYRP: 5.0000 mL | ORAL_SOLUTION | Freq: Four times a day (QID) | ORAL | 0 refills | Status: AC | PRN

## 2021-01-14 MED ORDER — PREDNISONE 20 MG PO TABS: 40.0000 mg | ORAL_TABLET | Freq: Every day | ORAL | 0 refills | Status: AC

## 2021-01-14 NOTE — ED Provider Notes (Signed)
[facility]    CSN: [id number] Arrival date & time: 01/14/21  0841      History   Chief Complaint Chief Complaint  Patient presents with   Cough    HPI Aaron Levine is a 35 y.o. male.   Presenting today with 1 day history of progressively worsening cough, chest tightness, nasal congestion, body aches, chills, fatigue.  Denies known fever, chest pain, significant shortness of breath, abdominal pain, nausea vomiting or diarrhea.  So far not trying anything over-the-counter for symptoms.  Does have an albuterol inhaler at home for asthma and has a history of seasonal allergies not currently on his Allegra.  Wife home sick with similar symptoms.  Past Medical History:  Diagnosis Date   Allergy    Anxiety    Asthma    Seasonal allergies     Patient Active Problem List   Diagnosis Date Noted   Loss of consciousness (HCC) 07/17/2019   Head injury 07/17/2019   Anxiety 07/17/2019    Past Surgical History:  Procedure Laterality Date   DENTAL SURGERY     wisdom teeth removed   TYMPANOSTOMY TUBE PLACEMENT         Home Medications    Prior to Admission medications   Medication Sig Start Date End Date Taking? Authorizing Provider  predniSONE (DELTASONE) 20 MG tablet Take 2 tablets (40 mg total) by mouth daily with breakfast. 01/14/21  Yes Particia Nearing, PA-C  promethazine-dextromethorphan (PROMETHAZINE-DM) 6.25-15 MG/5ML syrup Take 5 mLs by mouth 4 (four) times daily as needed for cough. 01/14/21  Yes Particia Nearing, PA-C  ALPRAZolam Prudy Feeler) 0.25 MG tablet Take 0.25 mg by mouth 3 (three) times daily as needed for anxiety.    [provider]  busPIRone (BUSPAR) 5 MG tablet Take 5 mg by mouth every morning. 07/23/20   [provider]  naproxen (NAPROSYN) 375 MG tablet Take 1 tablet (375 mg total) by mouth 2 (two) times daily. 09/28/20   Bing Neighbors, FNP  sulfamethoxazole-trimethoprim (BACTRIM DS) 800-160 MG tablet Take 1  tablet by mouth 2 (two) times daily. 09/28/20   Bing Neighbors, FNP    Family History Family History  Problem Relation Age of Onset   Healthy Mother    Healthy Father     Social History Social History   Tobacco Use   Smoking status: Every Day    Packs/day: 0.50    Types: Cigarettes   Smokeless tobacco: Current    Types: Snuff   Tobacco comments:    uses 3/4 can of dip per day  Substance Use Topics   Alcohol use: Yes    Alcohol/week: 2.0 standard drinks    Types: 2 Standard drinks or equivalent per week    Comment: Occass- 12 pack per day   Drug use: No     Allergies   Erythromycin and Penicillins   Review of Systems Review of Systems Per HPI  Physical Exam Triage Vital Signs ED Triage Vitals  Enc Vitals Group     BP 01/14/21 0945 (!) 136/91     Pulse Rate 01/14/21 0945 97     Resp 01/14/21 0945 19     Temp 01/14/21 0945 98.7 F (37.1 C)     Temp src --      SpO2 01/14/21 0945 98 %     Weight --      Height --      Head Circumference --      Peak Flow --  Pain Score 01/14/21 0944 0     Pain Loc --      Pain Edu? --      Excl. in GC? --    No data found.  Updated Vital Signs BP (!) 136/91   Pulse 97   Temp 98.7 F (37.1 C)   Resp 19   SpO2 98%   Visual Acuity Right Eye Distance:   Left Eye Distance:   Bilateral Distance:    Right Eye Near:   Left Eye Near:    Bilateral Near:     Physical Exam Vitals and nursing note reviewed.  Constitutional:      Appearance: Normal appearance.  HENT:     Head: Atraumatic.     Right Ear: Tympanic membrane normal.     Left Ear: Tympanic membrane normal.     Nose: Rhinorrhea present.     Mouth/Throat:     Mouth: Mucous membranes are moist.     Pharynx: Posterior oropharyngeal erythema present. No oropharyngeal exudate.  Eyes:     Extraocular Movements: Extraocular movements intact.     Conjunctiva/sclera: Conjunctivae normal.  Cardiovascular:     Rate and Rhythm: Normal rate and regular  rhythm.  Pulmonary:     Effort: Pulmonary effort is normal. No respiratory distress.     Breath sounds: Wheezing present. No rales.  Abdominal:     General: Bowel sounds are normal. There is no distension.     Palpations: Abdomen is soft.     Tenderness: There is no abdominal tenderness. There is no guarding.  Musculoskeletal:        General: Normal range of motion.     Cervical back: Normal range of motion and neck supple.  Skin:    General: Skin is warm and dry.  Neurological:     General: No focal deficit present.     Mental Status: He is oriented to person, place, and time.     Motor: No weakness.     Gait: Gait normal.  Psychiatric:        Mood and Affect: Mood normal.        Thought Content: Thought content normal.        Judgment: Judgment normal.    UC Treatments / Results  Labs (all labs ordered are listed, but only abnormal results are displayed) Labs Reviewed  COVID-19, FLU A+B NAA    EKG   Radiology No results found.  Procedures Procedures (including critical care time)  Medications Ordered in UC Medications - No data to display  Initial Impression / Assessment and Plan / UC Course  I have reviewed the triage vital signs and the nursing notes.  Pertinent labs & imaging results that were available during my care of the patient were reviewed by me and considered in my medical decision making (see chart for details).     Consistent with viral upper respiratory infection, COVID and flu testing pending, vital signs and exam overall reassuring today without red flag findings.  Will give prednisone burst, Phenergan DM for symptomatic benefit particularly given asthma exacerbation.  Continue albuterol as needed, restart allergy regimen and discussed DayQuil, NyQuil, Mucinex over-the-counter.  Return for acutely worsening symptoms.  Work note given.  Final Clinical Impressions(s) / UC Diagnoses   Final diagnoses:  Viral URI with cough  Mild intermittent  asthma with acute exacerbation  Seasonal allergic rhinitis due to other allergic trigger   Discharge Instructions   None    ED Prescriptions  Medication Sig Dispense Auth. Provider   promethazine-dextromethorphan (PROMETHAZINE-DM) 6.25-15 MG/5ML syrup Take 5 mLs by mouth 4 (four) times daily as needed for cough. 100 mL Particia Nearing, PA-C   predniSONE (DELTASONE) 20 MG tablet Take 2 tablets (40 mg total) by mouth daily with breakfast. 10 tablet Particia Nearing, [place]      PDMP not reviewed this encounter.   Particia Nearing, [place] 01/14/21 1029

## 2021-01-14 NOTE — ED Triage Notes (Signed)
Pt presents with complaints of cough, congestion, body aches, congestion, and chills that started last night. Wife is home sick with bronchitis.

## 2021-01-15 LAB — COVID-19, FLU A+B NAA
Influenza A, NAA: NOT DETECTED
Influenza B, NAA: NOT DETECTED
SARS-CoV-2, NAA: NOT DETECTED

## 2022-05-25 ENCOUNTER — Encounter: Payer: Self-pay | Admitting: Radiology

## 2022-09-28 ENCOUNTER — Encounter: Payer: Self-pay | Admitting: Emergency Medicine

## 2022-09-28 ENCOUNTER — Other Ambulatory Visit: Payer: Self-pay

## 2022-09-28 ENCOUNTER — Ambulatory Visit (INDEPENDENT_AMBULATORY_CARE_PROVIDER_SITE_OTHER): Payer: Medicaid Other

## 2022-09-28 ENCOUNTER — Ambulatory Visit
Admission: EM | Admit: 2022-09-28 | Discharge: 2022-09-28 | Disposition: A | Discharge status: Home / Self Care | Payer: Medicaid Other | Attending: Family Medicine | Admitting: Family Medicine

## 2022-09-28 DIAGNOSIS — S20211A Contusion of right front wall of thorax, initial encounter: Secondary | ICD-10-CM

## 2022-09-28 DIAGNOSIS — R0781 Pleurodynia: Secondary | ICD-10-CM | POA: Diagnosis not present

## 2022-09-28 NOTE — ED Triage Notes (Addendum)
Pt reports fell while getting in bathtub and reports right rib pain x2 weeks. Denies hitting head or loc. Chest expansion symmetrical. No bruise or contusion noted.

## 2022-10-02 NOTE — ED Provider Notes (Signed)
[facility]    CSN: [id number] Arrival date & time: 09/28/22  1807      History   Chief Complaint Chief Complaint  Patient presents with   Fall    HPI Aaron Levine is a 37 y.o. male.   Presenting today with 2-week history of right-sided rib pain after falling while getting in the bathtub and hitting the area on the side of the tub.  He denies hitting his head or loss of consciousness, shortness of breath, wheezing, dizziness, palpitations.  So far not tried anything over-the-counter for symptoms.    Past Medical History:  Diagnosis Date   Allergy    Anxiety    Asthma    Seasonal allergies     Patient Active Problem List   Diagnosis Date Noted   Loss of consciousness (HCC) 07/17/2019   Head injury 07/17/2019   Anxiety 07/17/2019    Past Surgical History:  Procedure Laterality Date   DENTAL SURGERY     wisdom teeth removed   TYMPANOSTOMY TUBE PLACEMENT         Home Medications    Prior to Admission medications   Medication Sig Start Date End Date Taking? Authorizing Provider  ALPRAZolam (XANAX) 0.25 MG tablet Take 0.25 mg by mouth 3 (three) times daily as needed for anxiety.    [provider]  busPIRone (BUSPAR) 5 MG tablet Take 5 mg by mouth every morning. 07/23/20   [provider]  naproxen (NAPROSYN) 375 MG tablet Take 1 tablet (375 mg total) by mouth 2 (two) times daily. 09/28/20   Bing Neighbors, NP  predniSONE (DELTASONE) 20 MG tablet Take 2 tablets (40 mg total) by mouth daily with breakfast. 01/14/21   Particia Nearing, PA-C  promethazine-dextromethorphan (PROMETHAZINE-DM) 6.25-15 MG/5ML syrup Take 5 mLs by mouth 4 (four) times daily as needed for cough. 01/14/21   Particia Nearing, PA-C  sulfamethoxazole-trimethoprim (BACTRIM DS) 800-160 MG tablet Take 1 tablet by mouth 2 (two) times daily. 09/28/20   Bing Neighbors, NP    Family History Family History  Problem Relation Age of Onset   Healthy  Mother    Healthy Father     Social History Social History   Tobacco Use   Smoking status: Every Day    Packs/day: .5    Types: Cigarettes   Smokeless tobacco: Current    Types: Snuff   Tobacco comments:    uses 3/4 can of dip per day  Substance Use Topics   Alcohol use: Yes    Alcohol/week: 2.0 standard drinks of alcohol    Types: 2 Standard drinks or equivalent per week    Comment: Occass- 12 pack per day   Drug use: No     Allergies   Erythromycin and Penicillins   Review of Systems Review of Systems Per HPI  Physical Exam Triage Vital Signs ED Triage Vitals  Enc Vitals Group     BP 09/28/22 1912 132/86     Pulse Rate 09/28/22 1912 72     Resp 09/28/22 1912 20     Temp 09/28/22 1912 97.7 F (36.5 C)     Temp Source 09/28/22 1912 Oral     SpO2 09/28/22 1912 97 %     Weight --      Height --      Head Circumference --      Peak Flow --      Pain Score 09/28/22 1911 3     Pain Loc --  Pain Edu? --      Excl. in GC? --    No data found.  Updated Vital Signs BP 132/86 (BP Location: Right Arm)   Pulse 72   Temp 97.7 F (36.5 C) (Oral)   Resp 20   SpO2 97%   Visual Acuity Right Eye Distance:   Left Eye Distance:   Bilateral Distance:    Right Eye Near:   Left Eye Near:    Bilateral Near:     Physical Exam Vitals and nursing note reviewed.  Constitutional:      Appearance: Normal appearance.  HENT:     Head: Atraumatic.  Eyes:     Extraocular Movements: Extraocular movements intact.     Conjunctiva/sclera: Conjunctivae normal.  Cardiovascular:     Rate and Rhythm: Normal rate and regular rhythm.  Pulmonary:     Effort: Pulmonary effort is normal.     Breath sounds: Normal breath sounds.     Comments: Chest rise symmetric bilaterally Musculoskeletal:        General: Tenderness and signs of injury present. No swelling or deformity. Normal range of motion.     Cervical back: Normal range of motion and neck supple.     Comments:  Right anterior and lateral ribs tender to palpation without bony deformity palpable or skin changes  Skin:    General: Skin is warm and dry.     Findings: No bruising or erythema.  Neurological:     General: No focal deficit present.     Mental Status: He is oriented to person, place, and time.     Motor: No weakness.     Gait: Gait normal.  Psychiatric:        Mood and Affect: Mood normal.        Thought Content: Thought content normal.        Judgment: Judgment normal.      UC Treatments / Results  Labs (all labs ordered are listed, but only abnormal results are displayed) Labs Reviewed - No data to display  EKG   Radiology No results found.  Procedures Procedures (including critical care time)  Medications Ordered in UC Medications - No data to display  Initial Impression / Assessment and Plan / UC Course  I have reviewed the triage vital signs and the nursing notes.  Pertinent labs & imaging results that were available during my care of the patient were reviewed by me and considered in my medical decision making (see chart for details).     X-ray of the right ribs negative for acute bony abnormality.  Suspect rib contusion.  Treat with over-the-counter pain relievers, warm compresses, rest.  Return for worsening symptoms.  Final Clinical Impressions(s) / UC Diagnoses   Final diagnoses:  Rib contusion, right, initial encounter   Discharge Instructions   None    ED Prescriptions   None    PDMP not reviewed this encounter.   Particia Nearing, [place] 10/02/22 1626

## 2023-04-19 ENCOUNTER — Ambulatory Visit
Admission: EM | Admit: 2023-04-19 | Discharge: 2023-04-19 | Disposition: A | Discharge status: Home / Self Care | Payer: Medicaid Other | Attending: Nurse Practitioner | Admitting: Nurse Practitioner

## 2023-04-19 DIAGNOSIS — J0140 Acute pansinusitis, unspecified: Secondary | ICD-10-CM | POA: Diagnosis not present

## 2023-04-19 MED ORDER — BENZONATATE 100 MG PO CAPS: 100.0000 mg | ORAL_CAPSULE | Freq: Three times a day (TID) | ORAL | 0 refills | Status: AC | PRN

## 2023-04-19 MED ORDER — DOXYCYCLINE HYCLATE 100 MG PO CAPS: 100.0000 mg | ORAL_CAPSULE | Freq: Two times a day (BID) | ORAL | 0 refills | Status: AC

## 2023-04-19 NOTE — Discharge Instructions (Signed)
You have a bacterial sinus infection.  Take the doxycycline as prescribed to treat it. Symptoms should improve over the next week to 10 days.  If you develop chest pain or shortness of breath, go to the emergency room.  Some things that can make you feel better are: - Increased rest - Increasing fluid with water/sugar free electrolytes - Acetaminophen and ibuprofen as needed for fever/pain - Salt water gargling, chloraseptic spray and throat lozenges - OTC guaifenesin (Mucinex) 600 mg twice daily for congestion - Saline sinus flushes or a neti pot - Humidifying the air -Tessalon Perles every 8 hours as needed for dry cough

## 2023-04-19 NOTE — ED Triage Notes (Signed)
Pt reports cough, congestion, sinus drainage, body aches x 3 weeks

## 2023-04-19 NOTE — ED Provider Notes (Signed)
[facility]    CSN: [id number] Arrival date & time: 04/19/23  1112      History   Chief Complaint No chief complaint on file.   HPI Aaron Levine is a 38 y.o. male.   Patient presents today with 2 to 3-week history of bodyaches, chills, congested cough that is worse in the morning and evening, dry cough throughout the day, chest tightness, stuffy nose, scratchy throat, sinus pressure and headache, bilateral ear popping in the morning when he blows his nose, decreased appetite, and fatigue.  Patient denies fever, shortness of breath, chest pain, abdominal pain, nausea/vomiting, or diarrhea.  No known sick contacts.  Has been taking DayQuil and Mucinex for symptoms with minimal improvement.  Patient reports history of asthma as a child, has a rescue inhaler that is probably expired if he ever needs it.  Has not felt the need to have an inhaler since he has been sick.  He also smokes cigarettes but does not vape.  Reports allergies to penicillins and erythromycin-thinks he had hives with both as a child.    Past Medical History:  Diagnosis Date   Allergy    Anxiety    Asthma    Seasonal allergies     Patient Active Problem List   Diagnosis Date Noted   Loss of consciousness (HCC) 07/17/2019   Head injury 07/17/2019   Anxiety 07/17/2019    Past Surgical History:  Procedure Laterality Date   DENTAL SURGERY     wisdom teeth removed   TYMPANOSTOMY TUBE PLACEMENT         Home Medications    Prior to Admission medications   Medication Sig Start Date End Date Taking? Authorizing Provider  benzonatate (TESSALON) 100 MG capsule Take 1 capsule (100 mg total) by mouth 3 (three) times daily as needed for cough. Do not take with alcohol or while operating or driving heavy machinery 8/65/78  Yes Cathlean Marseilles A, NP  doxycycline (VIBRAMYCIN) 100 MG capsule Take 1 capsule (100 mg total) by mouth 2 (two) times daily for 7 days. 04/19/23 04/26/23 Yes Valentino Nose, NP  ALPRAZolam Prudy Feeler) 0.25 MG tablet Take 0.25 mg by mouth 3 (three) times daily as needed for anxiety.    [provider]  busPIRone (BUSPAR) 5 MG tablet Take 5 mg by mouth every morning. 07/23/20   [provider]  naproxen (NAPROSYN) 375 MG tablet Take 1 tablet (375 mg total) by mouth 2 (two) times daily. 09/28/20   Bing Neighbors, NP  predniSONE (DELTASONE) 20 MG tablet Take 2 tablets (40 mg total) by mouth daily with breakfast. 01/14/21   Particia Nearing, PA-C  promethazine-dextromethorphan (PROMETHAZINE-DM) 6.25-15 MG/5ML syrup Take 5 mLs by mouth 4 (four) times daily as needed for cough. 01/14/21   Particia Nearing, PA-C    Family History Family History  Problem Relation Age of Onset   Healthy Mother    Healthy Father     Social History Social History   Tobacco Use   Smoking status: Every Day    Current packs/day: 0.50    Types: Cigarettes   Smokeless tobacco: Current    Types: Snuff   Tobacco comments:    uses 3/4 can of dip per day  Substance Use Topics   Alcohol use: Yes    Alcohol/week: 2.0 standard drinks of alcohol    Types: 2 Standard drinks or equivalent per week    Comment: Occass- 12 pack per day   Drug use:  No     Allergies   Erythromycin and Penicillins   Review of Systems Review of Systems Per HPI  Physical Exam Triage Vital Signs ED Triage Vitals  Encounter Vitals Group     BP 04/19/23 1238 (!) 149/93     Systolic BP Percentile --      Diastolic BP Percentile --      Pulse Rate 04/19/23 1238 65     Resp 04/19/23 1238 18     Temp 04/19/23 1238 97.6 F (36.4 C)     Temp Source 04/19/23 1238 Oral     SpO2 04/19/23 1238 99 %     Weight --      Height --      Head Circumference --      Peak Flow --      Pain Score 04/19/23 1241 0     Pain Loc --      Pain Education --      Exclude from Growth Chart --    No data found.  Updated Vital Signs BP (!) 149/93 (BP Location: Right Arm)   Pulse 65    Temp 97.6 F (36.4 C) (Oral)   Resp 18   SpO2 99%   Visual Acuity Right Eye Distance:   Left Eye Distance:   Bilateral Distance:    Right Eye Near:   Left Eye Near:    Bilateral Near:     Physical Exam Vitals and nursing note reviewed.  Constitutional:      General: He is not in acute distress.    Appearance: Normal appearance. He is not ill-appearing or toxic-appearing.  HENT:     Head: Normocephalic and atraumatic.     Right Ear: Tympanic membrane, ear canal and external ear normal.     Left Ear: Tympanic membrane, ear canal and external ear normal.     Nose: Congestion present. No rhinorrhea.     Right Sinus: No maxillary sinus tenderness or frontal sinus tenderness.     Left Sinus: No maxillary sinus tenderness or frontal sinus tenderness.     Comments: No pain with palpation of the sinuses, however patient has significant pressure when he leans forward in his face    Mouth/Throat:     Mouth: Mucous membranes are moist.     Pharynx: Oropharynx is clear. No oropharyngeal exudate or posterior oropharyngeal erythema.  Eyes:     General: No scleral icterus.    Extraocular Movements: Extraocular movements intact.  Cardiovascular:     Rate and Rhythm: Normal rate and regular rhythm.  Pulmonary:     Effort: Pulmonary effort is normal. No respiratory distress.     Breath sounds: Normal breath sounds. No wheezing, rhonchi or rales.  Musculoskeletal:     Cervical back: Normal range of motion and neck supple.  Lymphadenopathy:     Cervical: No cervical adenopathy.  Skin:    General: Skin is warm and dry.     Coloration: Skin is not jaundiced or pale.     Findings: No erythema or rash.  Neurological:     Mental Status: He is alert and oriented to person, place, and time.  Psychiatric:        Behavior: Behavior is cooperative.      UC Treatments / Results  Labs (all labs ordered are listed, but only abnormal results are displayed) Labs Reviewed - No data to  display  EKG   Radiology No results found.  Procedures Procedures (including critical care time)  Medications Ordered  in UC Medications - No data to display  Initial Impression / Assessment and Plan / UC Course  I have reviewed the triage vital signs and the nursing notes.  Pertinent labs & imaging results that were available during my care of the patient were reviewed by me and considered in my medical decision making (see chart for details).   Patient is well-appearing, normotensive, afebrile, not tachycardic, not tachypneic, oxygenating well on room air.    1. Acute non-recurrent pansinusitis Given allergy to penicillin, will treat with doxycycline twice daily for 7 days Other supportive care discussed including cough suppressant medication, Mucinex, hydration, nasal saline rinses Return and ER precautions discussed with patient Work excuse provided  The patient was given the opportunity to ask questions.  All questions answered to their satisfaction.  The patient is in agreement to this plan.    Final Clinical Impressions(s) / UC Diagnoses   Final diagnoses:  Acute non-recurrent pansinusitis     Discharge Instructions      You have a bacterial sinus infection.  Take the doxycycline as prescribed to treat it. Symptoms should improve over the next week to 10 days.  If you develop chest pain or shortness of breath, go to the emergency room.  Some things that can make you feel better are: - Increased rest - Increasing fluid with water/sugar free electrolytes - Acetaminophen and ibuprofen as needed for fever/pain - Salt water gargling, chloraseptic spray and throat lozenges - OTC guaifenesin (Mucinex) 600 mg twice daily for congestion - Saline sinus flushes or a neti pot - Humidifying the air -Tessalon Perles every 8 hours as needed for dry cough      ED Prescriptions     Medication Sig Dispense Auth. Provider   doxycycline (VIBRAMYCIN) 100 MG capsule Take 1  capsule (100 mg total) by mouth 2 (two) times daily for 7 days. 14 capsule Cathlean Marseilles A, NP   benzonatate (TESSALON) 100 MG capsule Take 1 capsule (100 mg total) by mouth 3 (three) times daily as needed for cough. Do not take with alcohol or while operating or driving heavy machinery 21 capsule Valentino Nose, NP      PDMP not reviewed this encounter.   Valentino Nose, NP 04/19/23 [id number]

## 2024-03-03 DIAGNOSIS — Z131 Encounter for screening for diabetes mellitus: Secondary | ICD-10-CM | POA: Diagnosis not present

## 2024-03-03 DIAGNOSIS — F419 Anxiety disorder, unspecified: Secondary | ICD-10-CM | POA: Diagnosis not present

## 2024-03-03 DIAGNOSIS — Z0000 Encounter for general adult medical examination without abnormal findings: Secondary | ICD-10-CM | POA: Diagnosis not present

## 2024-03-03 DIAGNOSIS — Z125 Encounter for screening for malignant neoplasm of prostate: Secondary | ICD-10-CM | POA: Diagnosis not present

## 2024-03-03 DIAGNOSIS — Z13220 Encounter for screening for lipoid disorders: Secondary | ICD-10-CM | POA: Diagnosis not present

## 2024-03-03 DIAGNOSIS — Z130 Encounter for screening for diseases of the blood and blood-forming organs and certain disorders involving the immune mechanism: Secondary | ICD-10-CM | POA: Diagnosis not present

## 2024-03-03 DIAGNOSIS — J45909 Unspecified asthma, uncomplicated: Secondary | ICD-10-CM | POA: Diagnosis not present

## 2024-03-03 DIAGNOSIS — Z6820 Body mass index (BMI) 20.0-20.9, adult: Secondary | ICD-10-CM | POA: Diagnosis not present
